# Patient Record
Sex: Female | Born: 1940 | Race: White | Hispanic: No | Marital: Single | Smoking: Current every day smoker
Health system: Southern US, Community
[De-identification: ages and names within clinical notes are randomized; demographics above are authoritative.]

## PROBLEM LIST (undated history)

## (undated) DIAGNOSIS — J449 Chronic obstructive pulmonary disease, unspecified: Secondary | ICD-10-CM

## (undated) DIAGNOSIS — K219 Gastro-esophageal reflux disease without esophagitis: Secondary | ICD-10-CM

## (undated) DIAGNOSIS — I10 Essential (primary) hypertension: Secondary | ICD-10-CM

## (undated) DIAGNOSIS — M1990 Unspecified osteoarthritis, unspecified site: Secondary | ICD-10-CM

## (undated) HISTORY — PX: CHOLECYSTECTOMY: SHX55

## (undated) HISTORY — PX: COLONOSCOPY: SHX174

## (undated) HISTORY — PX: TONSILLECTOMY: SUR1361

## (undated) HISTORY — PX: BACK SURGERY: SHX140

---

## 2004-10-01 ENCOUNTER — Ambulatory Visit: Payer: Self-pay | Admitting: Family Medicine

## 2005-11-06 ENCOUNTER — Ambulatory Visit: Payer: Self-pay | Admitting: Family Medicine

## 2006-05-21 ENCOUNTER — Ambulatory Visit: Payer: Self-pay | Admitting: Unknown Physician Specialty

## 2006-09-15 ENCOUNTER — Ambulatory Visit: Payer: Self-pay | Admitting: Family Medicine

## 2006-09-23 ENCOUNTER — Ambulatory Visit: Payer: Self-pay | Admitting: Anesthesiology

## 2006-10-05 ENCOUNTER — Ambulatory Visit: Payer: Self-pay | Admitting: Anesthesiology

## 2006-10-13 ENCOUNTER — Ambulatory Visit (HOSPITAL_COMMUNITY): Admission: RE | Admit: 2006-10-13 | Discharge: 2006-10-13 | Payer: Self-pay | Admitting: Neurosurgery

## 2006-11-11 ENCOUNTER — Encounter: Payer: Self-pay | Admitting: Neurosurgery

## 2006-12-10 ENCOUNTER — Encounter: Payer: Self-pay | Admitting: Neurosurgery

## 2007-01-14 ENCOUNTER — Ambulatory Visit: Payer: Self-pay | Admitting: Family Medicine

## 2007-10-01 ENCOUNTER — Ambulatory Visit: Payer: Self-pay | Admitting: Internal Medicine

## 2008-05-31 ENCOUNTER — Ambulatory Visit: Payer: Self-pay | Admitting: Obstetrics and Gynecology

## 2009-07-18 ENCOUNTER — Ambulatory Visit: Payer: Self-pay | Admitting: Internal Medicine

## 2010-03-04 ENCOUNTER — Ambulatory Visit: Payer: Self-pay | Admitting: Internal Medicine

## 2010-06-21 ENCOUNTER — Encounter: Payer: Self-pay | Admitting: Neurosurgery

## 2010-07-11 ENCOUNTER — Encounter: Payer: Self-pay | Admitting: Neurosurgery

## 2010-08-11 ENCOUNTER — Encounter: Payer: Self-pay | Admitting: Neurosurgery

## 2010-08-20 ENCOUNTER — Ambulatory Visit: Payer: Self-pay | Admitting: Internal Medicine

## 2011-09-11 ENCOUNTER — Ambulatory Visit: Payer: Self-pay | Admitting: Internal Medicine

## 2011-12-18 ENCOUNTER — Ambulatory Visit: Payer: Self-pay

## 2012-09-13 ENCOUNTER — Ambulatory Visit: Payer: Self-pay | Admitting: Internal Medicine

## 2012-11-05 ENCOUNTER — Ambulatory Visit: Payer: Self-pay | Admitting: Physical Medicine and Rehabilitation

## 2013-02-16 ENCOUNTER — Ambulatory Visit: Payer: Self-pay | Admitting: Unknown Physician Specialty

## 2013-03-17 ENCOUNTER — Ambulatory Visit: Payer: Self-pay | Admitting: Unknown Physician Specialty

## 2013-03-21 LAB — PATHOLOGY REPORT

## 2013-09-14 ENCOUNTER — Ambulatory Visit: Payer: Self-pay | Admitting: Internal Medicine

## 2014-02-16 ENCOUNTER — Encounter: Payer: Self-pay | Admitting: Physical Medicine and Rehabilitation

## 2014-03-11 ENCOUNTER — Encounter: Payer: Self-pay | Admitting: Physical Medicine and Rehabilitation

## 2014-03-22 ENCOUNTER — Ambulatory Visit: Payer: Self-pay | Admitting: Physical Medicine and Rehabilitation

## 2014-07-13 ENCOUNTER — Ambulatory Visit: Payer: Self-pay | Admitting: Internal Medicine

## 2014-07-17 ENCOUNTER — Other Ambulatory Visit (HOSPITAL_COMMUNITY): Payer: Self-pay | Admitting: Neurosurgery

## 2014-07-18 ENCOUNTER — Ambulatory Visit: Payer: Self-pay | Admitting: Unknown Physician Specialty

## 2014-08-10 NOTE — Pre-Procedure Instructions (Signed)
Patricia Reid  08/10/2014   Your procedure is scheduled on:  Monday, Jan. 11th   Report to [facility] Admitting at  7:45 AM.             (Arrival time is per your surgeon's request)   Call this number if you have problems the morning of surgery: [phone]   Remember:   Do not eat food or drink liquids after midnight Sunday.   Take these medicines the morning of surgery with A SIP OF WATER: Nexium, Gabapentin, Tramadol.  Nasonex spray.    Do not wear jewelry, make-up or nail polish.  Do not wear lotions, powders, or perfumes. You may NOT wear deodorant the day of surgery.  Do not shave underarms & legs 48 hours prior to surgery.   Do not bring valuables to the hospital.  [facility] is not responsible for any belongings or valuables.               Contacts, dentures or bridgework may not be worn into surgery.  Leave suitcase in the car. After surgery it may be brought to your room.  For patients admitted to the hospital, discharge time is determined by your treatment team.    Name and phone number of your driver:    Special Instructions: "Preparing for Surgery" instruction sheet.   Please read over the following fact sheets that you were given: Pain Booklet, Coughing and Deep Breathing, Blood Transfusion Information, MRSA Information and Surgical Site Infection Prevention

## 2014-08-14 ENCOUNTER — Other Ambulatory Visit: Payer: Self-pay

## 2014-08-14 ENCOUNTER — Encounter (HOSPITAL_COMMUNITY): Payer: Self-pay

## 2014-08-14 ENCOUNTER — Encounter (HOSPITAL_COMMUNITY)
Admission: RE | Admit: 2014-08-14 | Discharge: 2014-08-14 | Disposition: A | Discharge status: Home / Self Care | Payer: Medicare Other | Source: Ambulatory Visit | Attending: Neurosurgery | Admitting: Neurosurgery

## 2014-08-14 DIAGNOSIS — K219 Gastro-esophageal reflux disease without esophagitis: Secondary | ICD-10-CM | POA: Insufficient documentation

## 2014-08-14 DIAGNOSIS — Z9049 Acquired absence of other specified parts of digestive tract: Secondary | ICD-10-CM | POA: Diagnosis not present

## 2014-08-14 DIAGNOSIS — F17210 Nicotine dependence, cigarettes, uncomplicated: Secondary | ICD-10-CM | POA: Diagnosis not present

## 2014-08-14 DIAGNOSIS — I10 Essential (primary) hypertension: Secondary | ICD-10-CM | POA: Diagnosis not present

## 2014-08-14 DIAGNOSIS — J449 Chronic obstructive pulmonary disease, unspecified: Secondary | ICD-10-CM | POA: Diagnosis not present

## 2014-08-14 DIAGNOSIS — Z01818 Encounter for other preprocedural examination: Secondary | ICD-10-CM | POA: Diagnosis present

## 2014-08-14 HISTORY — DX: Gastro-esophageal reflux disease without esophagitis: K21.9

## 2014-08-14 HISTORY — DX: Chronic obstructive pulmonary disease, unspecified: J44.9

## 2014-08-14 HISTORY — DX: Essential (primary) hypertension: I10

## 2014-08-14 LAB — CBC
HCT: 38.7 % (ref 36.0–46.0)
Hemoglobin: 12.5 g/dL (ref 12.0–15.0)
MCH: 25.9 pg — AB (ref 26.0–34.0)
MCHC: 32.3 g/dL (ref 30.0–36.0)
MCV: 80.3 fL (ref 78.0–100.0)
Platelets: 278 10*3/uL (ref 150–400)
RBC: 4.82 MIL/uL (ref 3.87–5.11)
RDW: 16.2 % — AB (ref 11.5–15.5)
WBC: 6.9 10*3/uL (ref 4.0–10.5)

## 2014-08-14 LAB — BASIC METABOLIC PANEL
Anion gap: 6 (ref 5–15)
BUN: 7 mg/dL (ref 6–23)
CHLORIDE: 97 meq/L (ref 96–112)
CO2: 29 mmol/L (ref 19–32)
CREATININE: 0.73 mg/dL (ref 0.50–1.10)
Calcium: 9 mg/dL (ref 8.4–10.5)
GFR calc Af Amer: >90 mL/min (ref >90)
GFR, EST NON AFRICAN AMERICAN: 84 mL/min — AB (ref >90)
Glucose, Bld: 103 mg/dL — ABNORMAL HIGH (ref 70–99)
POTASSIUM: 3.4 mmol/L — AB (ref 3.5–5.1)
SODIUM: 132 mmol/L — AB (ref 135–145)

## 2014-08-14 LAB — SURGICAL PCR SCREEN
MRSA, PCR: NEGATIVE
STAPHYLOCOCCUS AUREUS: NEGATIVE

## 2014-08-14 NOTE — Progress Notes (Signed)
Patient does not want to wear the blue blood band x 1 week.  She's had neither children or blood transfusion.  She understands she will get sample drawn the DOS.

## 2014-08-15 NOTE — Progress Notes (Signed)
Anesthesia Chart Review:  Patient is a 74 year old female scheduled for L4-5 PLIF on 08/21/14 by Dr. Arnoldo Morale.  History includes smoking, COPD, HTN, GERD, cholecystectomy, tonsillectomy, back surgery '08. PCP is Dr. Kerrin Mo (see Care Everywhere).  08/14/14 EKG: ST at 102 bpm, non-specific ST/T wave abnormality, new since previous tracing in [facility] from 10/12/06. No previous cardiac studies per PAT history.  Preoperative labs  She requested that T&S be done on the day of surgery.  No CV symptoms documented at her PAT visit.  She does smoke.  Further evaluation by her assigned anesthesiologist on the day of surgery, but if no acute cardiopulmonary issues then I would anticipate that she could proceed as planned.  George Hugh [facility] Short Stay Center/Anesthesiology Phone [phone] 08/15/2014 3:33 PM

## 2014-08-20 MED ORDER — CEFAZOLIN SODIUM-DEXTROSE 2-3 GM-% IV SOLR
2.0000 g | INTRAVENOUS | Status: AC
  Administered 2014-08-21 (×2): 2 g via INTRAVENOUS
  Filled 2014-08-20: qty 50

## 2014-08-21 ENCOUNTER — Encounter (HOSPITAL_COMMUNITY): Payer: Self-pay | Admitting: *Deleted

## 2014-08-21 ENCOUNTER — Encounter (HOSPITAL_COMMUNITY)
Admission: RE | Disposition: A | Discharge status: Home / Self Care | Payer: Medicare Other | Source: Ambulatory Visit | Attending: Neurosurgery

## 2014-08-21 ENCOUNTER — Inpatient Hospital Stay (HOSPITAL_COMMUNITY): Payer: Medicare Other | Admitting: Certified Registered Nurse Anesthetist

## 2014-08-21 ENCOUNTER — Inpatient Hospital Stay (HOSPITAL_COMMUNITY): Payer: Medicare Other

## 2014-08-21 ENCOUNTER — Inpatient Hospital Stay (HOSPITAL_COMMUNITY): Payer: Medicare Other | Admitting: Vascular Surgery

## 2014-08-21 ENCOUNTER — Inpatient Hospital Stay (HOSPITAL_COMMUNITY)
Admission: RE | Admit: 2014-08-21 | Discharge: 2014-08-22 | DRG: 460 | Disposition: A | Discharge status: Home / Self Care | Payer: Medicare Other | Source: Ambulatory Visit | Attending: Neurosurgery | Admitting: Neurosurgery

## 2014-08-21 DIAGNOSIS — M5116 Intervertebral disc disorders with radiculopathy, lumbar region: Secondary | ICD-10-CM | POA: Diagnosis present

## 2014-08-21 DIAGNOSIS — I10 Essential (primary) hypertension: Secondary | ICD-10-CM | POA: Diagnosis present

## 2014-08-21 DIAGNOSIS — Z79899 Other long term (current) drug therapy: Secondary | ICD-10-CM

## 2014-08-21 DIAGNOSIS — J449 Chronic obstructive pulmonary disease, unspecified: Secondary | ICD-10-CM | POA: Diagnosis present

## 2014-08-21 DIAGNOSIS — Z885 Allergy status to narcotic agent status: Secondary | ICD-10-CM

## 2014-08-21 DIAGNOSIS — Z9049 Acquired absence of other specified parts of digestive tract: Secondary | ICD-10-CM | POA: Diagnosis present

## 2014-08-21 DIAGNOSIS — K219 Gastro-esophageal reflux disease without esophagitis: Secondary | ICD-10-CM | POA: Diagnosis present

## 2014-08-21 DIAGNOSIS — M4806 Spinal stenosis, lumbar region: Secondary | ICD-10-CM | POA: Diagnosis present

## 2014-08-21 DIAGNOSIS — M479 Spondylosis, unspecified: Secondary | ICD-10-CM

## 2014-08-21 DIAGNOSIS — F17210 Nicotine dependence, cigarettes, uncomplicated: Secondary | ICD-10-CM | POA: Diagnosis present

## 2014-08-21 DIAGNOSIS — M4316 Spondylolisthesis, lumbar region: Secondary | ICD-10-CM | POA: Diagnosis present

## 2014-08-21 DIAGNOSIS — Z419 Encounter for procedure for purposes other than remedying health state, unspecified: Secondary | ICD-10-CM

## 2014-08-21 LAB — ABO/RH: ABO/RH(D): O POS

## 2014-08-21 LAB — TYPE AND SCREEN
ABO/RH(D): O POS
ANTIBODY SCREEN: NEGATIVE

## 2014-08-21 SURGERY — POSTERIOR LUMBAR FUSION 1 LEVEL
Anesthesia: General

## 2014-08-21 MED ORDER — ACETAMINOPHEN 650 MG RE SUPP: 650.0000 mg | RECTAL | Status: DC | PRN

## 2014-08-21 MED ORDER — MIDAZOLAM HCL 5 MG/5ML IJ SOLN
INTRAMUSCULAR | Status: DC | PRN
  Administered 2014-08-21: 2 mg via INTRAVENOUS

## 2014-08-21 MED ORDER — HYDROCHLOROTHIAZIDE 12.5 MG PO CAPS
12.5000 mg | ORAL_CAPSULE | Freq: Every day | ORAL | Status: DC
  Administered 2014-08-22: 12.5 mg via ORAL
  Filled 2014-08-21 (×2): qty 1

## 2014-08-21 MED ORDER — SODIUM CHLORIDE 0.9 % IV SOLN
10.0000 mg | INTRAVENOUS | Status: DC | PRN
  Administered 2014-08-21: 5 ug/min via INTRAVENOUS

## 2014-08-21 MED ORDER — ROCURONIUM BROMIDE 50 MG/5ML IV SOLN
INTRAVENOUS | Status: AC
  Filled 2014-08-21: qty 1

## 2014-08-21 MED ORDER — MIDAZOLAM HCL 2 MG/2ML IJ SOLN
INTRAMUSCULAR | Status: AC
  Filled 2014-08-21: qty 2

## 2014-08-21 MED ORDER — FENTANYL CITRATE 0.05 MG/ML IJ SOLN
25.0000 ug | INTRAMUSCULAR | Status: DC | PRN
  Administered 2014-08-21 (×3): 50 ug via INTRAVENOUS

## 2014-08-21 MED ORDER — DEXMEDETOMIDINE BOLUS VIA INFUSION
0.7000 ug/kg | Freq: Once | INTRAVENOUS | Status: AC
  Administered 2014-08-21: 47.11 ug via INTRAVENOUS
  Filled 2014-08-21: qty 48

## 2014-08-21 MED ORDER — ONDANSETRON HCL 4 MG/2ML IJ SOLN
INTRAMUSCULAR | Status: DC | PRN
  Administered 2014-08-21: 4 mg via INTRAVENOUS

## 2014-08-21 MED ORDER — OXYCODONE HCL 5 MG PO TABS
5.0000 mg | ORAL_TABLET | Freq: Once | ORAL | Status: AC | PRN
  Administered 2014-08-21: 5 mg via ORAL

## 2014-08-21 MED ORDER — NEOSTIGMINE METHYLSULFATE 10 MG/10ML IV SOLN
INTRAVENOUS | Status: AC
  Filled 2014-08-21: qty 1

## 2014-08-21 MED ORDER — LACTATED RINGERS IV SOLN
INTRAVENOUS | Status: DC
  Administered 2014-08-21: 50 mL/h via INTRAVENOUS

## 2014-08-21 MED ORDER — LIDOCAINE HCL (CARDIAC) 20 MG/ML IV SOLN
INTRAVENOUS | Status: DC | PRN
  Administered 2014-08-21: 40 mg via INTRAVENOUS

## 2014-08-21 MED ORDER — SODIUM CHLORIDE 0.9 % IJ SOLN
INTRAMUSCULAR | Status: AC
  Filled 2014-08-21: qty 10

## 2014-08-21 MED ORDER — MIDAZOLAM HCL 2 MG/2ML IJ SOLN
1.0000 mg | INTRAMUSCULAR | Status: DC | PRN
Start: 2014-08-21 — End: 2014-08-21
  Administered 2014-08-21: 1 mg via INTRAVENOUS

## 2014-08-21 MED ORDER — HEMOSTATIC AGENTS (NO CHARGE) OPTIME
TOPICAL | Status: DC | PRN
  Administered 2014-08-21: 1 via TOPICAL

## 2014-08-21 MED ORDER — LACTATED RINGERS IV SOLN
INTRAVENOUS | Status: DC | PRN
  Administered 2014-08-21 (×2): via INTRAVENOUS

## 2014-08-21 MED ORDER — DIAZEPAM 5 MG PO TABS
ORAL_TABLET | ORAL | Status: AC
  Filled 2014-08-21: qty 1

## 2014-08-21 MED ORDER — 0.9 % SODIUM CHLORIDE (POUR BTL) OPTIME
TOPICAL | Status: DC | PRN
  Administered 2014-08-21: 1000 mL

## 2014-08-21 MED ORDER — ONDANSETRON HCL 4 MG/2ML IJ SOLN
INTRAMUSCULAR | Status: AC
  Filled 2014-08-21: qty 2

## 2014-08-21 MED ORDER — OXYCODONE HCL 5 MG PO TABS
ORAL_TABLET | ORAL | Status: AC
  Filled 2014-08-21: qty 1

## 2014-08-21 MED ORDER — OXYCODONE-ACETAMINOPHEN 5-325 MG PO TABS
1.0000 | ORAL_TABLET | ORAL | Status: DC | PRN
  Administered 2014-08-21: 2 via ORAL
  Filled 2014-08-21: qty 2

## 2014-08-21 MED ORDER — OXYCODONE HCL 5 MG/5ML PO SOLN: 5.0000 mg | Freq: Once | ORAL | Status: AC | PRN

## 2014-08-21 MED ORDER — PROPOFOL 10 MG/ML IV BOLUS
INTRAVENOUS | Status: DC | PRN
  Administered 2014-08-21: 140 mg via INTRAVENOUS

## 2014-08-21 MED ORDER — FLUTICASONE PROPIONATE 50 MCG/ACT NA SUSP
1.0000 | Freq: Every day | NASAL | Status: DC
  Administered 2014-08-22: 1 via NASAL
  Filled 2014-08-21: qty 16

## 2014-08-21 MED ORDER — DEXAMETHASONE SODIUM PHOSPHATE 4 MG/ML IJ SOLN
INTRAMUSCULAR | Status: AC
  Filled 2014-08-21: qty 2

## 2014-08-21 MED ORDER — ALUM & MAG HYDROXIDE-SIMETH 200-200-20 MG/5ML PO SUSP: 30.0000 mL | Freq: Four times a day (QID) | ORAL | Status: DC | PRN

## 2014-08-21 MED ORDER — ADULT MULTIVITAMIN LIQUID CH
5.0000 mL | Freq: Every day | ORAL | Status: DC
  Filled 2014-08-21: qty 5

## 2014-08-21 MED ORDER — ONDANSETRON HCL 4 MG/2ML IJ SOLN: 4.0000 mg | INTRAMUSCULAR | Status: DC | PRN

## 2014-08-21 MED ORDER — FENTANYL CITRATE 0.05 MG/ML IJ SOLN
INTRAMUSCULAR | Status: DC | PRN
  Administered 2014-08-21: 100 ug via INTRAVENOUS
  Administered 2014-08-21 (×6): 25 ug via INTRAVENOUS
  Administered 2014-08-21: 50 ug via INTRAVENOUS

## 2014-08-21 MED ORDER — LIDOCAINE HCL (CARDIAC) 20 MG/ML IV SOLN
INTRAVENOUS | Status: AC
  Filled 2014-08-21: qty 5

## 2014-08-21 MED ORDER — GABAPENTIN 300 MG PO CAPS
300.0000 mg | ORAL_CAPSULE | Freq: Three times a day (TID) | ORAL | Status: DC
  Administered 2014-08-22: 300 mg via ORAL
  Filled 2014-08-21 (×4): qty 1

## 2014-08-21 MED ORDER — CEFAZOLIN SODIUM-DEXTROSE 2-3 GM-% IV SOLR
2.0000 g | Freq: Three times a day (TID) | INTRAVENOUS | Status: AC
  Administered 2014-08-21 – 2014-08-22 (×2): 2 g via INTRAVENOUS
  Filled 2014-08-21 (×2): qty 50

## 2014-08-21 MED ORDER — NEOSTIGMINE METHYLSULFATE 10 MG/10ML IV SOLN
INTRAVENOUS | Status: DC | PRN
  Administered 2014-08-21: 4 mg via INTRAVENOUS

## 2014-08-21 MED ORDER — BUPIVACAINE-EPINEPHRINE (PF) 0.5% -1:200000 IJ SOLN
INTRAMUSCULAR | Status: DC | PRN
  Administered 2014-08-21: 10 mL

## 2014-08-21 MED ORDER — FENTANYL CITRATE 0.05 MG/ML IJ SOLN
INTRAMUSCULAR | Status: AC
  Filled 2014-08-21: qty 5

## 2014-08-21 MED ORDER — DEXAMETHASONE SODIUM PHOSPHATE 4 MG/ML IJ SOLN
INTRAMUSCULAR | Status: DC | PRN
  Administered 2014-08-21: 8 mg via INTRAVENOUS

## 2014-08-21 MED ORDER — GLYCOPYRROLATE 0.2 MG/ML IJ SOLN
INTRAMUSCULAR | Status: DC | PRN
  Administered 2014-08-21: 0.6 mg via INTRAVENOUS

## 2014-08-21 MED ORDER — PHENOL 1.4 % MT LIQD: 1.0000 | OROMUCOSAL | Status: DC | PRN

## 2014-08-21 MED ORDER — ROCURONIUM BROMIDE 100 MG/10ML IV SOLN
INTRAVENOUS | Status: DC | PRN
  Administered 2014-08-21: 40 mg via INTRAVENOUS
  Administered 2014-08-21: 10 mg via INTRAVENOUS

## 2014-08-21 MED ORDER — DOCUSATE SODIUM 100 MG PO CAPS
100.0000 mg | ORAL_CAPSULE | Freq: Two times a day (BID) | ORAL | Status: DC
  Administered 2014-08-21 – 2014-08-22 (×2): 100 mg via ORAL
  Filled 2014-08-21 (×2): qty 1

## 2014-08-21 MED ORDER — ARTIFICIAL TEARS OP OINT
TOPICAL_OINTMENT | OPHTHALMIC | Status: AC
  Filled 2014-08-21: qty 3.5

## 2014-08-21 MED ORDER — HYDROCODONE-ACETAMINOPHEN 5-325 MG PO TABS: 1.0000 | ORAL_TABLET | ORAL | Status: DC | PRN

## 2014-08-21 MED ORDER — ESTRADIOL 0.1 MG/GM VA CREA
0.2500 | TOPICAL_CREAM | VAGINAL | Status: DC
  Filled 2014-08-21: qty 42.5

## 2014-08-21 MED ORDER — LACTATED RINGERS IV SOLN: INTRAVENOUS | Status: DC

## 2014-08-21 MED ORDER — PANTOPRAZOLE SODIUM 40 MG PO TBEC
80.0000 mg | DELAYED_RELEASE_TABLET | Freq: Every day | ORAL | Status: DC
  Administered 2014-08-22: 80 mg via ORAL
  Filled 2014-08-21: qty 2

## 2014-08-21 MED ORDER — PROPOFOL 10 MG/ML IV BOLUS
INTRAVENOUS | Status: AC
  Filled 2014-08-21: qty 20

## 2014-08-21 MED ORDER — PHENYLEPHRINE HCL 10 MG/ML IJ SOLN
INTRAMUSCULAR | Status: DC | PRN
  Administered 2014-08-21: 40 ug via INTRAVENOUS
  Administered 2014-08-21 (×2): 80 ug via INTRAVENOUS
  Administered 2014-08-21: 40 ug via INTRAVENOUS

## 2014-08-21 MED ORDER — SODIUM CHLORIDE 0.9 % IR SOLN
Status: DC | PRN
  Administered 2014-08-21: 14:00:00

## 2014-08-21 MED ORDER — DEXMEDETOMIDINE HCL IN NACL 200 MCG/50ML IV SOLN
0.7000 ug/kg/h | INTRAVENOUS | Status: DC
  Administered 2014-08-21: 0.7 ug/kg/h via INTRAVENOUS
  Filled 2014-08-21: qty 50

## 2014-08-21 MED ORDER — AZELASTINE HCL 0.1 % NA SOLN
1.0000 | Freq: Two times a day (BID) | NASAL | Status: DC
  Administered 2014-08-21 – 2014-08-22 (×2): 1 via NASAL
  Filled 2014-08-21: qty 30

## 2014-08-21 MED ORDER — ARTIFICIAL TEARS OP OINT
TOPICAL_OINTMENT | OPHTHALMIC | Status: DC | PRN
  Administered 2014-08-21: 1 via OPHTHALMIC

## 2014-08-21 MED ORDER — THROMBIN 20000 UNITS EX SOLR
CUTANEOUS | Status: DC | PRN
  Administered 2014-08-21: 14:00:00 via TOPICAL

## 2014-08-21 MED ORDER — CEFAZOLIN SODIUM-DEXTROSE 2-3 GM-% IV SOLR
INTRAVENOUS | Status: AC
  Filled 2014-08-21: qty 50

## 2014-08-21 MED ORDER — ONDANSETRON HCL 4 MG/2ML IJ SOLN: 4.0000 mg | Freq: Once | INTRAMUSCULAR | Status: DC | PRN

## 2014-08-21 MED ORDER — FENTANYL CITRATE 0.05 MG/ML IJ SOLN
INTRAMUSCULAR | Status: AC
  Filled 2014-08-21: qty 2

## 2014-08-21 MED ORDER — GLYCOPYRROLATE 0.2 MG/ML IJ SOLN
INTRAMUSCULAR | Status: AC
  Filled 2014-08-21: qty 3

## 2014-08-21 MED ORDER — MENTHOL 3 MG MT LOZG: 1.0000 | LOZENGE | OROMUCOSAL | Status: DC | PRN

## 2014-08-21 MED ORDER — HYDROCHLOROTHIAZIDE 25 MG PO TABS
12.5000 mg | ORAL_TABLET | Freq: Every day | ORAL | Status: DC
  Filled 2014-08-21: qty 0.5

## 2014-08-21 MED ORDER — PHENYLEPHRINE 40 MCG/ML (10ML) SYRINGE FOR IV PUSH (FOR BLOOD PRESSURE SUPPORT)
PREFILLED_SYRINGE | INTRAVENOUS | Status: AC
  Filled 2014-08-21: qty 10

## 2014-08-21 MED ORDER — ACETAMINOPHEN 325 MG PO TABS: 650.0000 mg | ORAL_TABLET | ORAL | Status: DC | PRN

## 2014-08-21 MED ORDER — DIAZEPAM 5 MG PO TABS
5.0000 mg | ORAL_TABLET | Freq: Four times a day (QID) | ORAL | Status: DC | PRN
  Administered 2014-08-21 (×2): 5 mg via ORAL
  Filled 2014-08-21: qty 1

## 2014-08-21 MED ORDER — MORPHINE SULFATE 2 MG/ML IJ SOLN
1.0000 mg | INTRAMUSCULAR | Status: DC | PRN
  Administered 2014-08-21 – 2014-08-22 (×3): 2 mg via INTRAVENOUS
  Filled 2014-08-21 (×3): qty 1

## 2014-08-21 SURGICAL SUPPLY — 68 items
BAG DECANTER FOR FLEXI CONT (MISCELLANEOUS) ×3 IMPLANT
BENZOIN TINCTURE PRP APPL 2/3 (GAUZE/BANDAGES/DRESSINGS) ×3 IMPLANT
BRUSH SCRUB EZ PLAIN DRY (MISCELLANEOUS) ×3 IMPLANT
BUR MATCHSTICK NEURO 3.0 LAGG (BURR) ×3 IMPLANT
BUR PRECISION FLUTE 6.0 (BURR) ×3 IMPLANT
CANISTER SUCT 3000ML (MISCELLANEOUS) ×3 IMPLANT
CAP REVERE LOCKING (Cap) ×12 IMPLANT
CLOSURE WOUND 1/2 X4 (GAUZE/BANDAGES/DRESSINGS) ×1
CONT SPEC 4OZ CLIKSEAL STRL BL (MISCELLANEOUS) ×3 IMPLANT
CORDS BIPOLAR (ELECTRODE) ×3 IMPLANT
COVER BACK TABLE 60X90IN (DRAPES) ×3 IMPLANT
DRAPE C-ARM 42X72 X-RAY (DRAPES) ×6 IMPLANT
DRAPE LAPAROTOMY 100X72X124 (DRAPES) ×3 IMPLANT
DRAPE POUCH INSTRU U-SHP 10X18 (DRAPES) ×3 IMPLANT
DRAPE PROXIMA HALF (DRAPES) ×3 IMPLANT
DRAPE SURG 17X23 STRL (DRAPES) ×12 IMPLANT
DURASEAL APPLICATOR TIP (TIP) ×3 IMPLANT
DURASEAL SPINE SEALANT 3ML (MISCELLANEOUS) ×3 IMPLANT
ELECT BLADE 4.0 EZ CLEAN MEGAD (MISCELLANEOUS) ×3
ELECT REM PT RETURN 9FT ADLT (ELECTROSURGICAL) ×3
ELECTRODE BLDE 4.0 EZ CLN MEGD (MISCELLANEOUS) ×1 IMPLANT
ELECTRODE REM PT RTRN 9FT ADLT (ELECTROSURGICAL) ×1 IMPLANT
EVACUATOR 1/8 PVC DRAIN (DRAIN) IMPLANT
GAUZE SPONGE 4X4 12PLY STRL (GAUZE/BANDAGES/DRESSINGS) ×3 IMPLANT
GAUZE SPONGE 4X4 16PLY XRAY LF (GAUZE/BANDAGES/DRESSINGS) ×3 IMPLANT
GLOVE BIO SURGEON STRL SZ7 (GLOVE) ×3 IMPLANT
GLOVE BIO SURGEON STRL SZ8.5 (GLOVE) ×6 IMPLANT
GLOVE BIOGEL PI IND STRL 7.5 (GLOVE) ×1 IMPLANT
GLOVE BIOGEL PI INDICATOR 7.5 (GLOVE) ×2
GLOVE EXAM NITRILE LRG STRL (GLOVE) IMPLANT
GLOVE EXAM NITRILE MD LF STRL (GLOVE) IMPLANT
GLOVE EXAM NITRILE XL STR (GLOVE) IMPLANT
GLOVE EXAM NITRILE XS STR PU (GLOVE) IMPLANT
GLOVE SS BIOGEL STRL SZ 8 (GLOVE) ×2 IMPLANT
GLOVE SUPERSENSE BIOGEL SZ 8 (GLOVE) ×4
GOWN STRL REUS W/ TWL LRG LVL3 (GOWN DISPOSABLE) IMPLANT
GOWN STRL REUS W/ TWL XL LVL3 (GOWN DISPOSABLE) ×2 IMPLANT
GOWN STRL REUS W/TWL 2XL LVL3 (GOWN DISPOSABLE) IMPLANT
GOWN STRL REUS W/TWL LRG LVL3 (GOWN DISPOSABLE)
GOWN STRL REUS W/TWL XL LVL3 (GOWN DISPOSABLE) ×4
KIT BASIN OR (CUSTOM PROCEDURE TRAY) ×3 IMPLANT
KIT ROOM TURNOVER OR (KITS) ×3 IMPLANT
NEEDLE HYPO 21X1.5 SAFETY (NEEDLE) IMPLANT
NEEDLE HYPO 22GX1.5 SAFETY (NEEDLE) ×3 IMPLANT
NS IRRIG 1000ML POUR BTL (IV SOLUTION) ×3 IMPLANT
PACK LAMINECTOMY NEURO (CUSTOM PROCEDURE TRAY) ×3 IMPLANT
PAD ARMBOARD 7.5X6 YLW CONV (MISCELLANEOUS) ×9 IMPLANT
PATTIES SURGICAL .5 X1 (DISPOSABLE) IMPLANT
ROD REVERE CURVED 6.35X35MM (Rod) ×6 IMPLANT
SCREW REVERE 6.35 6.5MMX45 (Screw) ×6 IMPLANT
SCREW REVERE 6.5X50MM (Screw) ×9 IMPLANT
SPACER ALTERA 10X31 8-12MM-8 (Spacer) ×3 IMPLANT
SPONGE GAUZE 4X4 12PLY STER LF (GAUZE/BANDAGES/DRESSINGS) ×3 IMPLANT
SPONGE NEURO XRAY DETECT 1X3 (DISPOSABLE) IMPLANT
SPONGE SURGIFOAM ABS GEL 100 (HEMOSTASIS) ×3 IMPLANT
STRIP BIOACTIVE 20CC 25X100X8 (Miscellaneous) ×3 IMPLANT
STRIP CLOSURE SKIN 1/2X4 (GAUZE/BANDAGES/DRESSINGS) ×2 IMPLANT
SUT PROLENE 6 0 BV (SUTURE) ×3 IMPLANT
SUT VIC AB 1 CT1 18XBRD ANBCTR (SUTURE) ×2 IMPLANT
SUT VIC AB 1 CT1 8-18 (SUTURE) ×4
SUT VIC AB 2-0 CP2 18 (SUTURE) ×6 IMPLANT
SYR 20CC LL (SYRINGE) IMPLANT
SYR 20ML ECCENTRIC (SYRINGE) ×3 IMPLANT
TAPE CLOTH SURG 4X10 WHT LF (GAUZE/BANDAGES/DRESSINGS) ×3 IMPLANT
TOWEL OR 17X24 6PK STRL BLUE (TOWEL DISPOSABLE) ×3 IMPLANT
TOWEL OR 17X26 10 PK STRL BLUE (TOWEL DISPOSABLE) ×3 IMPLANT
TRAY FOLEY CATH 14FRSI W/METER (CATHETERS) ×3 IMPLANT
WATER STERILE IRR 1000ML POUR (IV SOLUTION) ×3 IMPLANT

## 2014-08-21 NOTE — Op Note (Signed)
Brief history: The patient is a 74 year old white female who has had previous lumbar surgery by another physician many years ago. She has developed recurrent back, buttock, and leg pain consistent with neurogenic claudication. She has failed medical management and was worked up with lumbar x-rays and a lumbar MRI. These demonstrated that the patient had a spondylolisthesis and spinal stenosis at L4-5. I discussed the various treatment options with the patient including surgery. She has weighed the risks, benefits, and alternative surgery and decided to proceed with an L4-5 decompression, instrumentation, and fusion.  Preoperative diagnosis: L4-5 spondylolisthesis, Degenerative disc disease, spinal stenosis compressing both the L4 and the L5 nerve roots; lumbago; lumbar radiculopathy  Postoperative diagnosis: The same  Procedure: Redo bilateral L4 Laminotomy/foraminotomies to decompress the bilateral L 4 and L5 nerve roots(the work required to do this was in addition to the work required to do the posterior lumbar interbody fusion because of the patient's spinal stenosis, facet arthropathy. Etc. requiring a wide decompression of the nerve roots.); Right L4-5 transforaminal lumbar interbody fusion with local morselized autograft bone and Kinnex graft extender; insertion of interbody prosthesis at L4-5 (globus peek" interbody prosthesis); posterior nonsegmental instrumentation from L4 to L5 with globus titanium pedicle screws and rods; posterior lateral arthrodesis at L4-5 with local morselized autograft bone and Kinnex bone graft extender.  Surgeon: Dr. Earle Gell  Asst.: Dr. Granville Lewis  Anesthesia: Gen. endotracheal  Estimated blood loss: 200 mL  Drains: One medium Hemovac  Complications: None  Description of procedure: The patient was brought to the operating room by the anesthesia team. General endotracheal anesthesia was induced. The patient was turned to the prone position on the Wilson  frame. The patient's lumbosacral region was then prepared with Betadine scrub and Betadine solution. Sterile drapes were applied.  I then injected the area to be incised with Marcaine with epinephrine solution. I then used the scalpel to make a linear midline incision over the L4-5 interspace. I then used electrocautery to perform a bilateral subperiosteal dissection exposing the spinous process and lamina of L4-5. We then obtained intraoperative radiograph to confirm our location. We then inserted the Verstrac retractor to provide exposure.  I began the decompression by using the high speed drill to perform laminotomies at L4 bilaterally, we encountered scar tissue on the right from the prior surgery. We then used the Kerrison punches to widen the laminotomy and removed the ligamentum flavum and epidural scar tissue at L4-5. We used the Kerrison punches to remove the medial facets at L4-5. We performed wide foraminotomies about the bilateral L4 and L5 nerve roots completing the decompression.  We now turned our attention to the posterior lumbar interbody fusion. I used a scalpel to incise the intervertebral disc at L4-5. I then performed a partial intervertebral discectomy at L4-5 using the pituitary forceps. We prepared the vertebral endplates at M8-4 for the fusion by removing the soft tissues with the curettes. We then used the trial spacers to pick the appropriate sized interbody prosthesis. We prefilled his prosthesis with a combination of local morselized autograft bone that we obtained during the decompression as well as Kinnex bone graft extender. We inserted the prefilled prosthesis into the interspace at L4-5 on the right. We expanded the prosthesis. There was a good snug fit of the prosthesis in the interspace. We then filled and the remainder of the intervertebral disc space with local morselized autograft bone and Kinnex. This completed the posterior lumbar interbody arthrodesis.  We now turned  attention to  the instrumentation. Under fluoroscopic guidance we cannulated the bilateral L4 and L5 pedicles with the bone probe. We then removed the bone probe. We then tapped the pedicle with a 5.5 millimeter tap. We then removed the tap. We probed inside the tapped pedicle with a ball probe to rule out cortical breaches. We then inserted a 6.5 x 45 and 50 millimeter pedicle screw into the L4 and L5 pedicles bilaterally under fluoroscopic guidance. We then palpated along the medial aspect of the pedicles to rule out cortical breaches. There were none. The nerve roots were not injured. We then connected the unilateral pedicle screws with a lordotic rod. We compressed the construct and secured the rod in place with the caps. We then tightened the caps appropriately. This completed the instrumentation from L4-5.  We now turned our attention to the posterior lateral arthrodesis at L4-5. We used the high-speed drill to decorticate the remainder of the facets, pars, transverse process at L4-5. We then applied a combination of local morselized autograft bone and Kinnex bone graft extender over these decorticated posterior lateral structures. This completed the posterior lateral arthrodesis.  We then obtained hemostasis using bipolar electrocautery. We irrigated the wound out with bacitracin solution. We inspected the thecal sac and nerve roots and noted they were well decompressed. We then removed the retractor. We placed a medium Hemovac drain in the epidural space and tunneled out through separate stab wound. We reapproximated patient's thoracolumbar fascia with interrupted #1 Vicryl suture. We reapproximated patient's subcutaneous tissue with interrupted 2-0 Vicryl suture. The reapproximated patient's skin with Steri-Strips and benzoin. The wound was then coated with bacitracin ointment. A sterile dressing was applied. The drapes were removed. The patient was subsequently returned to the supine position where they  were extubated by the anesthesia team. He was then transported to the post anesthesia care unit in stable condition. All sponge instrument and needle counts were reportedly correct at the end of this case.

## 2014-08-21 NOTE — Anesthesia Preprocedure Evaluation (Signed)
Anesthesia Evaluation  Patient identified by MRN, date of birth, ID band Patient awake    Reviewed: Allergy & Precautions, NPO status , Patient's Chart, lab work & pertinent test results  Airway Mallampati: II  TM Distance: >3 FB Neck ROM: Full    Dental  (+) Teeth Intact, Dental Advisory Given   Pulmonary Current Smoker,    breath sounds clear to auscultation       Cardiovascular hypertension,  Rhythm:Regular Rate:Normal     Neuro/Psych    GI/Hepatic   Endo/Other    Renal/GU      Musculoskeletal   Abdominal   Peds  Hematology   Anesthesia Other Findings   Reproductive/Obstetrics                            Anesthesia Physical Anesthesia Plan  ASA: III  Anesthesia Plan: General   Post-op Pain Management:    Induction: Intravenous  Airway Management Planned: Oral ETT  Additional Equipment:   Intra-op Plan:   Post-operative Plan: Extubation in OR  Informed Consent: I have reviewed the patients History and Physical, chart, labs and discussed the procedure including the risks, benefits and alternatives for the proposed anesthesia with the patient or authorized representative who has indicated his/her understanding and acceptance.   Dental advisory given  Plan Discussed with: CRNA and Anesthesiologist  Anesthesia Plan Comments:         Anesthesia Quick Evaluation  

## 2014-08-21 NOTE — Anesthesia Postprocedure Evaluation (Signed)
  Anesthesia Post-op Note  Patient: Patricia Reid  Procedure(s) Performed: Procedure(s) with comments: POSTERIOR LUMBAR FUSION 1 LEVEL (N/A) - L45 posterior lumbar interbody fusion with interbody prosthesis posterior lateral arthrodesis and posterior nonsegmental instrumentation  Patient Location: PACU  Anesthesia Type:General  Level of Consciousness: awake, alert  and oriented  Airway and Oxygen Therapy: Patient Spontanous Breathing and Patient connected to nasal cannula oxygen  Post-op Pain: mild  Post-op Assessment: Post-op Vital signs reviewed, Patient's Cardiovascular Status Stable, Respiratory Function Stable, Patent Airway, No signs of Nausea or vomiting and Pain level controlled  Post-op Vital Signs: stable  Last Vitals:  Filed Vitals:   08/21/14 1730  BP: 151/74  Pulse: 74  Temp: 36.3 C  Resp: 19    Complications: No apparent anesthesia complications

## 2014-08-21 NOTE — H&P (Signed)
Subjective: The patient is a 74 year old white female who has complained of back and leg pain consistent with neurogenic claudication. She has failed medical management and was worked up with a lumbar MRI and lumbar x-rays. These studies demonstrated an L4-5 spondylolisthesis with spinal stenosis. I discussed the various treatment options with the patient. She has decided to proceed with surgery.   Past Medical History  Diagnosis Date  . Hypertension   . GERD (gastroesophageal reflux disease)   . COPD (chronic obstructive pulmonary disease)     slim to none    Past Surgical History  Procedure Laterality Date  . Cholecystectomy    . Back surgery    . Tonsillectomy      Allergies  Allergen Reactions  . Codeine     Makes her feel strange    History  Substance Use Topics  . Smoking status: Current Every Day Smoker -- 1.00 packs/day for 50 years    Types: Cigarettes  . Smokeless tobacco: Not on file  . Alcohol Use: Yes     Comment: occasionally    History reviewed. No pertinent family history. Prior to Admission medications   Medication Sig Start Date End Date Taking? Authorizing Provider  azelastine (ASTELIN) 0.1 % nasal spray Place 1 spray into both nostrils 2 (two) times daily. 07/18/14  Yes Historical Provider, MD  Cholecalciferol (VITAMIN D) 2000 UNITS tablet Take 2,000 Units by mouth daily with supper.   Yes Historical Provider, MD  esomeprazole (NEXIUM) 40 MG capsule Take 40 mg by mouth 2 (two) times daily before a meal.   Yes Historical Provider, MD  estradiol (ESTRACE) 0.1 MG/GM vaginal cream Place 4.03 Applicatorfuls vaginally 2 (two) times a week. Days of the week vary   Yes Historical Provider, MD  gabapentin (NEURONTIN) 300 MG capsule Take 300 mg by mouth 3 (three) times daily. 06/07/14  Yes Historical Provider, MD  hydrochlorothiazide (HYDRODIURIL) 25 MG tablet Take 12.5 mg by mouth daily.  05/31/14  Yes Historical Provider, MD  mometasone (NASONEX) 50 MCG/ACT nasal  spray Place 2 sprays into both nostrils 2 (two) times daily.   Yes Historical Provider, MD  Multiple Vitamin (MULTIVITAMIN) LIQD Take 5 mLs by mouth daily.   Yes Historical Provider, MD  senna-docusate (SENNA-S) 8.6-50 MG per tablet Take 4 tablets by mouth daily with supper.   Yes Historical Provider, MD  temazepam (RESTORIL) 15 MG capsule Take 1 capsule by mouth at bedtime as needed.   Yes Historical Provider, MD  traMADol (ULTRAM) 50 MG tablet Take 25-100 mg by mouth 2 (two) times daily.  07/05/14  Yes Historical Provider, MD  Wheat Dextrin (BENEFIBER PO) Take 30 mLs by mouth 3 (three) times daily. Mix in 8 oz liquid and drink   Yes Historical Provider, MD  celecoxib (CELEBREX) 200 MG capsule Take 200 mg by mouth 2 (two) times daily.  06/07/14   Historical Provider, MD     Review of Systems  Positive ROS: As above  All other systems have been reviewed and were otherwise negative with the exception of those mentioned in the HPI and as above.  Objective: Vital signs in last 24 hours: Temp:  [98.4 F (36.9 C)] 98.4 F (36.9 C) (01/11 0810) Pulse Rate:  [93] 93 (01/11 0810) Resp:  [20] 20 (01/11 0810) BP: (175)/(70) 175/70 mmHg (01/11 0810) SpO2:  [97 %] 97 % (01/11 0810) Weight:  [67.302 kg (148 lb 6 oz)] 67.302 kg (148 lb 6 oz) (01/11 0810)  General Appearance: Alert, cooperative, no  distress, Head: Normocephalic, without obvious abnormality, atraumatic Eyes: PERRL, conjunctiva/corneas clear, EOM's intact,    Ears: Normal  Throat: Normal  Neck: Supple, symmetrical, trachea midline, no adenopathy; thyroid: No enlargement/tenderness/nodules; no carotid bruit or JVD Back: Symmetric, no curvature, ROM normal, no CVA tenderness Lungs: Clear to auscultation bilaterally, respirations unlabored Heart: Regular rate and rhythm, no murmur, rub or gallop Abdomen: Soft, non-tender,, no masses, no organomegaly Extremities: Extremities normal, atraumatic, no cyanosis or edema Pulses: 2+ and  symmetric all extremities Skin: Skin color, texture, turgor normal, no rashes or lesions  NEUROLOGIC:   Mental status: alert and oriented, no aphasia, good attention span, Fund of knowledge/ memory ok Motor Exam - grossly normal Sensory Exam - grossly normal Reflexes:  Coordination - grossly normal Gait - grossly normal Balance - grossly normal Cranial Nerves: I: smell Not tested  II: visual acuity  OS: Normal  OD: Normal   II: visual fields Full to confrontation  II: pupils Equal, round, reactive to light  III,VII: ptosis None  III,IV,VI: extraocular muscles  Full ROM  V: mastication Normal  V: facial light touch sensation  Normal  V,VII: corneal reflex  Present  VII: facial muscle function - upper  Normal  VII: facial muscle function - lower Normal  VIII: hearing Not tested  IX: soft palate elevation  Normal  IX,X: gag reflex Present  XI: trapezius strength  5/5  XI: sternocleidomastoid strength 5/5  XI: neck flexion strength  5/5  XII: tongue strength  Normal    Data Review Lab Results  Component Value Date   WBC 6.9 08/14/2014   HGB 12.5 08/14/2014   HCT 38.7 08/14/2014   MCV 80.3 08/14/2014   PLT 278 08/14/2014   Lab Results  Component Value Date   NA 132* 08/14/2014   K 3.4* 08/14/2014   CL 97 08/14/2014   CO2 29 08/14/2014   BUN 7 08/14/2014   CREATININE 0.73 08/14/2014   GLUCOSE 103* 08/14/2014   No results found for: INR, PROTIME  Assessment/Plan: L4-5 spondylolisthesis, spinal stenosis, lumbago, lumbar radiculopathy, neurogenic claudication: I have discussed the situation with the patient. I have reviewed her imaging studies with her and pointed out the abnormality is. We have discussed the various treatment options including surgery. I have described the surgical treatment option of a L4-5 decompression, instrumentation, and fusion. I have shown her surgical models. We have discussed the risks, benefits, alternatives, and likelihood of achieving goals  with surgery. I have answered all the patient's questions. She has decided to proceed with surgery.   Reign Dziuba D 08/21/2014 10:32 AM

## 2014-08-21 NOTE — Transfer of Care (Signed)
Immediate Anesthesia Transfer of Care Note  Patient: Patricia Reid  Procedure(s) Performed: Procedure(s) with comments: POSTERIOR LUMBAR FUSION 1 LEVEL (N/A) - L45 posterior lumbar interbody fusion with interbody prosthesis posterior lateral arthrodesis and posterior nonsegmental instrumentation  Patient Location: PACU  Anesthesia Type:General  Level of Consciousness: awake, alert , oriented and patient cooperative  Airway & Oxygen Therapy: Patient Spontanous Breathing and Patient connected to face mask oxygen  Post-op Assessment: Report given to PACU RN, Post -op Vital signs reviewed and stable and Patient moving all extremities X 4  Post vital signs: Reviewed and stable  Complications: No apparent anesthesia complications

## 2014-08-22 LAB — CBC
HEMATOCRIT: 28.8 % — AB (ref 36.0–46.0)
HEMOGLOBIN: 9.2 g/dL — AB (ref 12.0–15.0)
MCH: 25.4 pg — ABNORMAL LOW (ref 26.0–34.0)
MCHC: 31.9 g/dL (ref 30.0–36.0)
MCV: 79.6 fL (ref 78.0–100.0)
Platelets: 221 10*3/uL (ref 150–400)
RBC: 3.62 MIL/uL — ABNORMAL LOW (ref 3.87–5.11)
RDW: 15.8 % — ABNORMAL HIGH (ref 11.5–15.5)
WBC: 8 10*3/uL (ref 4.0–10.5)

## 2014-08-22 LAB — BASIC METABOLIC PANEL
Anion gap: 12 (ref 5–15)
BUN: <5 mg/dL — ABNORMAL LOW (ref 6–23)
CHLORIDE: 95 meq/L — AB (ref 96–112)
CO2: 25 mmol/L (ref 19–32)
Calcium: 8.5 mg/dL (ref 8.4–10.5)
Creatinine, Ser: 0.59 mg/dL (ref 0.50–1.10)
Glucose, Bld: 112 mg/dL — ABNORMAL HIGH (ref 70–99)
POTASSIUM: 3.1 mmol/L — AB (ref 3.5–5.1)
Sodium: 132 mmol/L — ABNORMAL LOW (ref 135–145)

## 2014-08-22 MED ORDER — DSS 100 MG PO CAPS: 100.0000 mg | ORAL_CAPSULE | Freq: Two times a day (BID) | ORAL | Status: DC

## 2014-08-22 MED ORDER — DIAZEPAM 5 MG PO TABS: 5.0000 mg | ORAL_TABLET | Freq: Four times a day (QID) | ORAL | Status: DC | PRN

## 2014-08-22 MED ORDER — HYDROMORPHONE HCL 4 MG PO TABS: 4.0000 mg | ORAL_TABLET | ORAL | Status: DC | PRN

## 2014-08-22 MED ORDER — HYDROMORPHONE HCL 2 MG PO TABS
4.0000 mg | ORAL_TABLET | ORAL | Status: DC | PRN
  Administered 2014-08-22 (×2): 4 mg via ORAL
  Filled 2014-08-22 (×2): qty 2

## 2014-08-22 NOTE — Progress Notes (Signed)
Pt doing well. Pt given D/C instructions with Rx's, verbal understanding was provided. Pt's incision was covered with gauze dressing and had no sign of infection. Pt's IV and Hemovac were removed prior to D/C. Pt D/C'd home via wheelchair @ 1400 per MD order. Pt is stable @ D/C and has no other needs at this time. Holli Humbles, RN

## 2014-08-22 NOTE — Discharge Summary (Signed)
  Physician Discharge Summary  Patient ID: Patricia Reid MRN: [id number] DOB/AGE: 02-10-1941 74 y.o.  Admit date: 08/21/2014 Discharge date: 08/22/2014  Admission Diagnoses: L4-5 spondylolisthesis, spinal stenosis, lumbago, lumbar radiculopathy with neurogenic claudication  Discharge Diagnoses: The same Active Problems:   Spondylolisthesis of lumbar region   Discharged Condition: good  Hospital Course: I performed an L4-5 decompression, instrumentation, and fusion on the patient on 08/21/2014. The surgery went well.  The patient's postoperative course was unremarkable. On postop day #1 the patient felt she would to go home later that day. The patient was given oral and written discharge instructions. All questions were answered.  Consults: PT Significant Diagnostic Studies: None Treatments: L4-5 decompression, instrumentation, and fusion. Discharge Exam: Blood pressure 132/64, pulse 83, temperature 98.4 F (36.9 C), temperature source Oral, resp. rate 18, height 5' 5.5" (1.664 m), weight 67.302 kg (148 lb 6 oz), SpO2 97 %. The patient is alert and pleasant. Her dressing is clean and dry. Her strength is normal in her lower extremities. She looks well.  Disposition: Home     Medication List    STOP taking these medications        celecoxib 200 MG capsule  Commonly known as:  CELEBREX     traMADol 50 MG tablet  Commonly known as:  ULTRAM      TAKE these medications        azelastine 0.1 % nasal spray  Commonly known as:  ASTELIN  Place 1 spray into both nostrils 2 (two) times daily.     BENEFIBER PO  Take 30 mLs by mouth 3 (three) times daily. Mix in 8 oz liquid and drink     diazepam 5 MG tablet  Commonly known as:  VALIUM  Take 1 tablet (5 mg total) by mouth every 6 (six) hours as needed for muscle spasms.     DSS 100 MG Caps  Take 100 mg by mouth 2 (two) times daily.     esomeprazole 40 MG capsule  Commonly known as:  NEXIUM  Take 40 mg by mouth 2 (two)  times daily before a meal.     estradiol 0.1 MG/GM vaginal cream  Commonly known as:  ESTRACE  Place 3.08 Applicatorfuls vaginally 2 (two) times a week. Days of the week vary     gabapentin 300 MG capsule  Commonly known as:  NEURONTIN  Take 300 mg by mouth 3 (three) times daily.     hydrochlorothiazide 25 MG tablet  Commonly known as:  HYDRODIURIL  Take 12.5 mg by mouth daily.     HYDROmorphone 4 MG tablet  Commonly known as:  DILAUDID  Take 1 tablet (4 mg total) by mouth every 4 (four) hours as needed for severe pain.     mometasone 50 MCG/ACT nasal spray  Commonly known as:  NASONEX  Place 2 sprays into both nostrils 2 (two) times daily.     multivitamin Liqd  Take 5 mLs by mouth daily.     SENNA-S 8.6-50 MG per tablet  Generic drug:  senna-docusate  Take 4 tablets by mouth daily with supper.     temazepam 15 MG capsule  Commonly known as:  RESTORIL  Take 1 capsule by mouth at bedtime as needed for sleep.     Vitamin D 2000 UNITS tablet  Take 2,000 Units by mouth daily with supper.         SignedOphelia Charter 08/22/2014, 7:47 AM

## 2014-08-22 NOTE — Progress Notes (Signed)
Utilization review completed.  

## 2014-08-22 NOTE — Evaluation (Signed)
Physical Therapy Evaluation Patient Details Name: Patricia Reid MRN: [id number] DOB: 26-Nov-1940 Today's Date: 08/22/2014   History of Present Illness  Pt is a 74 year old female s/p L4-L5 PLIF. Pt with PMH for COPD, HTN, GERD.   Clinical Impression  Pt demonstrates appropriate, safe mobility while following back precautions. Pt educated on who to adjust brace, when to wear brace and how to maintain back precautions during ADLs and IADLs once home. Pt does not require further acute PT education or intervention and will not need PT at discharge.      Follow Up Recommendations No PT follow up, Supervision - Intermittent     Equipment Recommendations  None recommended by PT    Recommendations for Other Services       Precautions / Restrictions Precautions Precautions: Back;Fall Precaution Booklet Issued: Yes (comment) Precaution Comments: Provided and reviewed precaution handout Required Braces or Orthoses: Spinal Brace Spinal Brace: Lumbar corset Restrictions Weight Bearing Restrictions: No      Mobility  Bed Mobility Overal bed mobility: Needs Assistance Bed Mobility: Rolling;Sidelying to Sit;Sit to Sidelying Rolling: Supervision Sidelying to sit: Supervision     Sit to sidelying: Supervision General bed mobility comments: Pt familiar with log roll technique and able to demonstrate for PT while maintaining back precautions.   Transfers Overall transfer level: Needs assistance Equipment used: None Transfers: Sit to/from Stand Sit to Stand: Supervision         General transfer comment: Pt able to safely transfer from sitting on lowered bed to standing without physical assistance or cuing from PT. Pt without loss of balance. Able to maintain precautions throughout transition.   Ambulation/Gait Ambulation/Gait assistance: Supervision Ambulation Distance (Feet): 180 Feet Assistive device: None Gait Pattern/deviations: Step-through pattern;Decreased step length -  left;Decreased step length - right;Decreased stride length;Wide base of support   Gait velocity interpretation: Below normal speed for age/gender General Gait Details: Pt able to safely ambulate in hallway without loss of balance or need for support or assist. Pt ambulating slowly due to back pain but was not reaching for support.   Stairs            Wheelchair Mobility    Modified Rankin (Stroke Patients Only)       Balance Overall balance assessment: Needs assistance         Standing balance support: During functional activity;No upper extremity supported Standing balance-Leahy Scale: Fair Standing balance comment: Pt with good standing balance and able to ambulate safely without support; limited currently by back pain                             Pertinent Vitals/Pain Pain Assessment: 0-10 Pain Score: 6  Pain Location: Low back; incision Pain Descriptors / Indicators: Aching;Sore Pain Intervention(s): Limited activity within patient's tolerance;Monitored during session;Premedicated before session;Repositioned    Home Living Family/patient expects to be discharged to:: Private residence Living Arrangements: Alone Available Help at Discharge: Family;Friend(s) Type of Home: House Home Access: Stairs to enter Entrance Stairs-Rails: None Entrance Stairs-Number of Steps: 1 Home Layout: One level Home Equipment: Cane - single point      Prior Function Level of Independence: Independent         Comments: Retired Pharmacist, hospital; still drives     Hand Dominance   Dominant Hand: Right    Extremity/Trunk Assessment               Lower Extremity Assessment: Overall WFL for tasks  assessed         Communication   Communication: No difficulties  Cognition Arousal/Alertness: Awake/alert Behavior During Therapy: WFL for tasks assessed/performed Overall Cognitive Status: Within Functional Limits for tasks assessed                       General Comments  Pt able to don and doff brace independently without cuing or physical assistance. Able to reiterate when to wear brace.     Exercises        Assessment/Plan    PT Assessment Patent does not need any further PT services  PT Diagnosis Acute pain   PT Problem List    PT Treatment Interventions     PT Goals (Current goals can be found in the Care Plan section)      Frequency     Barriers to discharge        Co-evaluation               End of Session Equipment Utilized During Treatment: Gait belt Activity Tolerance: Patient tolerated treatment well;Patient limited by pain Patient left: in bed;with call bell/phone within reach Nurse Communication: Mobility status         Time: 0913-0940 PT Time Calculation (min) (ACUTE ONLY): 27 min   Charges:   PT Evaluation $Initial PT Evaluation Tier I: 1 Procedure PT Treatments $Gait Training: 8-22 mins   PT G CodesJearld Shines SPT 08/22/2014, 10:55 AM  Jearld Shines, SPT  Acute Rehabilitation [phone]

## 2014-08-23 MED FILL — Sodium Chloride IV Soln 0.9%: INTRAVENOUS | Qty: 1000 | Status: AC

## 2014-08-23 MED FILL — Heparin Sodium (Porcine) Inj 1000 Unit/ML: INTRAMUSCULAR | Qty: 30 | Status: AC

## 2014-09-14 ENCOUNTER — Encounter (HOSPITAL_COMMUNITY): Payer: Self-pay | Admitting: Neurosurgery

## 2014-11-14 ENCOUNTER — Ambulatory Visit
Admit: 2014-11-14 | Disposition: A | Discharge status: Home / Self Care | Payer: Self-pay | Attending: Internal Medicine | Admitting: Internal Medicine

## 2015-01-23 ENCOUNTER — Encounter: Payer: Self-pay | Admitting: *Deleted

## 2015-01-24 ENCOUNTER — Encounter: Payer: Self-pay | Admitting: Unknown Physician Specialty

## 2015-01-24 ENCOUNTER — Encounter
Admission: RE | Disposition: A | Discharge status: Home / Self Care | Payer: Self-pay | Source: Ambulatory Visit | Attending: Unknown Physician Specialty

## 2015-01-24 ENCOUNTER — Ambulatory Visit
Admission: RE | Admit: 2015-01-24 | Discharge: 2015-01-24 | Disposition: A | Discharge status: Home / Self Care | Payer: Medicare Other | Source: Ambulatory Visit | Attending: Unknown Physician Specialty | Admitting: Unknown Physician Specialty

## 2015-01-24 ENCOUNTER — Ambulatory Visit: Payer: Medicare Other | Admitting: *Deleted

## 2015-01-24 DIAGNOSIS — K2970 Gastritis, unspecified, without bleeding: Secondary | ICD-10-CM | POA: Diagnosis not present

## 2015-01-24 DIAGNOSIS — J449 Chronic obstructive pulmonary disease, unspecified: Secondary | ICD-10-CM | POA: Insufficient documentation

## 2015-01-24 DIAGNOSIS — F17210 Nicotine dependence, cigarettes, uncomplicated: Secondary | ICD-10-CM | POA: Diagnosis not present

## 2015-01-24 DIAGNOSIS — B3781 Candidal esophagitis: Secondary | ICD-10-CM | POA: Insufficient documentation

## 2015-01-24 DIAGNOSIS — K219 Gastro-esophageal reflux disease without esophagitis: Secondary | ICD-10-CM | POA: Insufficient documentation

## 2015-01-24 DIAGNOSIS — I10 Essential (primary) hypertension: Secondary | ICD-10-CM | POA: Insufficient documentation

## 2015-01-24 DIAGNOSIS — K648 Other hemorrhoids: Secondary | ICD-10-CM | POA: Insufficient documentation

## 2015-01-24 DIAGNOSIS — D120 Benign neoplasm of cecum: Secondary | ICD-10-CM | POA: Insufficient documentation

## 2015-01-24 DIAGNOSIS — D125 Benign neoplasm of sigmoid colon: Secondary | ICD-10-CM | POA: Insufficient documentation

## 2015-01-24 DIAGNOSIS — Z79899 Other long term (current) drug therapy: Secondary | ICD-10-CM | POA: Insufficient documentation

## 2015-01-24 DIAGNOSIS — Z1211 Encounter for screening for malignant neoplasm of colon: Secondary | ICD-10-CM | POA: Diagnosis not present

## 2015-01-24 DIAGNOSIS — R1310 Dysphagia, unspecified: Secondary | ICD-10-CM | POA: Diagnosis present

## 2015-01-24 DIAGNOSIS — K621 Rectal polyp: Secondary | ICD-10-CM | POA: Diagnosis not present

## 2015-01-24 DIAGNOSIS — Z79891 Long term (current) use of opiate analgesic: Secondary | ICD-10-CM | POA: Diagnosis not present

## 2015-01-24 DIAGNOSIS — Z86010 Personal history of colonic polyps: Secondary | ICD-10-CM | POA: Diagnosis not present

## 2015-01-24 HISTORY — PX: COLONOSCOPY WITH PROPOFOL: SHX5780

## 2015-01-24 HISTORY — PX: ESOPHAGOGASTRODUODENOSCOPY: SHX5428

## 2015-01-24 HISTORY — PX: SAVORY DILATION: SHX5439

## 2015-01-24 SURGERY — COLONOSCOPY WITH PROPOFOL
Anesthesia: General

## 2015-01-24 MED ORDER — ESMOLOL HCL 10 MG/ML IV SOLN
INTRAVENOUS | Status: DC | PRN
  Administered 2015-01-24: 20 mg via INTRAVENOUS

## 2015-01-24 MED ORDER — GLYCOPYRROLATE 0.2 MG/ML IJ SOLN
INTRAMUSCULAR | Status: DC | PRN
  Administered 2015-01-24: 0.2 mg via INTRAVENOUS

## 2015-01-24 MED ORDER — SODIUM CHLORIDE 0.9 % IV SOLN: INTRAVENOUS | Status: DC

## 2015-01-24 MED ORDER — LACTATED RINGERS IV SOLN
INTRAVENOUS | Status: DC
  Administered 2015-01-24: 1000 mL via INTRAVENOUS

## 2015-01-24 MED ORDER — MIDAZOLAM HCL 5 MG/5ML IJ SOLN
INTRAMUSCULAR | Status: DC | PRN
Start: 2015-01-24 — End: 2015-01-24
  Administered 2015-01-24: 1 mg via INTRAVENOUS

## 2015-01-24 MED ORDER — PROPOFOL 10 MG/ML IV BOLUS
INTRAVENOUS | Status: DC | PRN
  Administered 2015-01-24: 40 mg via INTRAVENOUS
  Administered 2015-01-24: 20 mg via INTRAVENOUS

## 2015-01-24 MED ORDER — LIDOCAINE HCL (PF) 2 % IJ SOLN
INTRAMUSCULAR | Status: DC | PRN
  Administered 2015-01-24: 40 mg

## 2015-01-24 MED ORDER — PIPERACILLIN-TAZOBACTAM 3.375 G IVPB 30 MIN
3.3750 g | Freq: Once | INTRAVENOUS | Status: AC
  Administered 2015-01-24: 3.375 g via INTRAVENOUS
  Filled 2015-01-24: qty 50

## 2015-01-24 MED ORDER — ONDANSETRON HCL 4 MG/2ML IJ SOLN
INTRAMUSCULAR | Status: DC | PRN
Start: 2015-01-24 — End: 2015-01-24
  Administered 2015-01-24: 4 mg via INTRAVENOUS

## 2015-01-24 MED ORDER — PROPOFOL INFUSION 10 MG/ML OPTIME
INTRAVENOUS | Status: DC | PRN
  Administered 2015-01-24: 50 ug/kg/min via INTRAVENOUS

## 2015-01-24 MED ORDER — FENTANYL CITRATE (PF) 100 MCG/2ML IJ SOLN
INTRAMUSCULAR | Status: DC | PRN
  Administered 2015-01-24: 50 ug via INTRAVENOUS

## 2015-01-24 MED ORDER — PHENYLEPHRINE HCL 10 MG/ML IJ SOLN
INTRAMUSCULAR | Status: DC | PRN
  Administered 2015-01-24 (×4): 100 ug via INTRAVENOUS

## 2015-01-24 NOTE — Anesthesia Preprocedure Evaluation (Signed)
Anesthesia Evaluation  Patient identified by MRN, date of birth, ID band Patient awake    Reviewed: Allergy & Precautions, NPO status , Patient's Chart, lab work & pertinent test results  Airway Mallampati: II  TM Distance: >3 FB Neck ROM: Limited    Dental  (+) Teeth Intact   Pulmonary COPD A pack per day or more for many years. breath sounds clear to auscultation  Pulmonary exam normal       Cardiovascular Exercise Tolerance: Good hypertension, Pt. on medications Normal cardiovascular examRhythm:Regular Rate:Normal     Neuro/Psych    GI/Hepatic   Endo/Other    Renal/GU      Musculoskeletal   Abdominal (+)  Abdomen: soft.    Peds  Hematology   Anesthesia Other Findings   Reproductive/Obstetrics                             Anesthesia Physical Anesthesia Plan  ASA: II  Anesthesia Plan: General   Post-op Pain Management:    Induction: Intravenous  Airway Management Planned: Simple Face Mask  Additional Equipment:   Intra-op Plan:   Post-operative Plan:   Informed Consent: I have reviewed the patients History and Physical, chart, labs and discussed the procedure including the risks, benefits and alternatives for the proposed anesthesia with the patient or authorized representative who has indicated his/her understanding and acceptance.     Plan Discussed with: CRNA  Anesthesia Plan Comments:         Anesthesia Quick Evaluation

## 2015-01-24 NOTE — H&P (Signed)
Primary Care Physician:  Adin Hector, MD Primary Gastroenterologist:  Dr. Vira Agar  Pre-Procedure History & Physical: HPI:  Patricia Reid is a 74 y.o. female is here for an endoscopy and colonoscopy.   Past Medical History  Diagnosis Date  . Hypertension   . GERD (gastroesophageal reflux disease)   . COPD (chronic obstructive pulmonary disease)     slim to none    Past Surgical History  Procedure Laterality Date  . Cholecystectomy    . Back surgery    . Tonsillectomy    . Colonoscopy      Prior to Admission medications   Medication Sig Start Date End Date Taking? Authorizing Provider  azelastine (ASTELIN) 0.1 % nasal spray Place 1 spray into both nostrils 2 (two) times daily. 07/18/14  Yes Historical Provider, MD  celecoxib (CELEBREX) 200 MG capsule Take 200 mg by mouth 2 (two) times daily.   Yes Historical Provider, MD  cholecalciferol (VITAMIN D) 1000 UNITS tablet Take 1,000 Units by mouth daily.   Yes Historical Provider, MD  diazepam (VALIUM) 5 MG tablet Take 1 tablet (5 mg total) by mouth every 6 (six) hours as needed for muscle spasms. 08/22/14  Yes Newman Pies, MD  docusate sodium 100 MG CAPS Take 100 mg by mouth 2 (two) times daily. 08/22/14  Yes Newman Pies, MD  esomeprazole (NEXIUM) 40 MG capsule Take 40 mg by mouth 2 (two) times daily before a meal.   Yes Historical Provider, MD  estradiol (ESTRACE) 0.1 MG/GM vaginal cream Place 8.09 Applicatorfuls vaginally 2 (two) times a week. Days of the week vary   Yes Historical Provider, MD  fluticasone (FLONASE) 50 MCG/ACT nasal spray Place 2 sprays into both nostrils daily.   Yes Historical Provider, MD  gabapentin (NEURONTIN) 300 MG capsule Take 300 mg by mouth 2 (two) times daily.  06/07/14  Yes Historical Provider, MD  loratadine (CLARITIN) 10 MG tablet Take 1 tablet by mouth daily as needed for allergies.    Yes Historical Provider, MD  Multiple Vitamin (MULTIVITAMIN) LIQD Take 5 mLs by mouth daily.   Yes  Historical Provider, MD  polyethylene glycol powder (GLYCOLAX/MIRALAX) powder Take 17 g by mouth daily.   Yes Historical Provider, MD  sucralfate (CARAFATE) 1 G tablet Take 1 g by mouth 4 (four) times daily -  with meals and at bedtime.   Yes Historical Provider, MD  temazepam (RESTORIL) 15 MG capsule Take 1 capsule by mouth at bedtime as needed for sleep.    Yes Historical Provider, MD  traMADol (ULTRAM) 50 MG tablet Take 25-50 mg by mouth every 6 (six) hours as needed for severe pain.   Yes Historical Provider, MD  Wheat Dextrin (BENEFIBER PO) Take 30 mLs by mouth 3 (three) times daily as needed (constipation). Mix in 8 oz liquid and drink   Yes Historical Provider, MD  hydrochlorothiazide (HYDRODIURIL) 25 MG tablet Take 12.5 mg by mouth daily.  05/31/14   Historical Provider, MD  HYDROmorphone (DILAUDID) 4 MG tablet Take 1 tablet (4 mg total) by mouth every 4 (four) hours as needed for severe pain. Patient not taking: Reported on 01/23/2015 08/22/14   Newman Pies, MD    Allergies as of 12/20/2014 - Review Complete 08/21/2014  Allergen Reaction Noted  . Codeine  08/14/2014    No family history on file.  History   Social History  . Marital Status: Divorced    Spouse Name: N/A  . Number of Children: N/A  . Years  of Education: N/A   Occupational History  . Not on file.   Social History Main Topics  . Smoking status: Current Every Day Smoker -- 1.00 packs/day for 50 years    Types: Cigarettes  . Smokeless tobacco: Not on file  . Alcohol Use: Yes     Comment: occasionally  . Drug Use: Not on file  . Sexual Activity: Not on file   Other Topics Concern  . Not on file   Social History Narrative    Review of Systems: See HPI, otherwise negative ROS  Physical Exam: BP 156/81 mmHg  Pulse 101  Temp(Src) 98.3 F (36.8 C) (Oral)  Ht 5' 5.5" (1.664 m)  Wt 63.504 kg (140 lb)  BMI 22.93 kg/m2  SpO2 100% General:   Alert,  pleasant and cooperative in NAD Head:   Normocephalic and atraumatic. Neck:  Supple; no masses or thyromegaly. Lungs:  Clear throughout to auscultation.    Heart:  Regular rate and rhythm. Abdomen:  Soft, nontender and nondistended. Normal bowel sounds, without guarding, and without rebound.   Neurologic:  Alert and  oriented x4;  grossly normal neurologically.  Impression/Plan: Patricia Reid is here for an endoscopy and colonoscopy to be performed for dysphagia and personal history of colon polyps  Risks, benefits, limitations, and alternatives regarding  endoscopy and colonoscopy have been reviewed with the patient.  Questions have been answered.  All parties agreeable.   Gaylyn Cheers, MD  01/24/2015, 8:56 AM

## 2015-01-24 NOTE — Transfer of Care (Signed)
Immediate Anesthesia Transfer of Care Note  Patient: Patricia Reid  Procedure(s) Performed: Procedure(s): COLONOSCOPY WITH PROPOFOL (N/A) ESOPHAGOGASTRODUODENOSCOPY (EGD) (N/A) SAVORY DILATION (N/A)  Patient Location: PACU  Anesthesia Type:General  Level of Consciousness: sedated  Airway & Oxygen Therapy: Patient Spontanous Breathing and Patient connected to nasal cannula oxygen  Post-op Assessment: Report given to RN and Post -op Vital signs reviewed and stable  Post vital signs: Reviewed  Last Vitals:  Filed Vitals:   01/24/15 0832  BP: 156/81  Pulse: 101  Temp: 39.4 C    Complications: No apparent anesthesia complications

## 2015-01-24 NOTE — OR Nursing (Signed)
Patient was dilated with 48 savory dilator, then a 51 savory dilator. Patient tolerated procedure well.

## 2015-01-24 NOTE — Anesthesia Postprocedure Evaluation (Signed)
  Anesthesia Post-op Note  Patient: Patricia Reid  Procedure(s) Performed: Procedure(s): COLONOSCOPY WITH PROPOFOL (N/A) ESOPHAGOGASTRODUODENOSCOPY (EGD) (N/A) SAVORY DILATION (N/A)  Anesthesia type:General  Patient location: PACU  Post pain: Pain level controlled  Post assessment: Post-op Vital signs reviewed, Patient's Cardiovascular Status Stable, Respiratory Function Stable, Patent Airway and No signs of Nausea or vomiting  Post vital signs: Reviewed and stable  Last Vitals:  Filed Vitals:   01/24/15 0950  BP: 93/75  Pulse: 66  Temp:   Resp: 15    Level of consciousness: awake, alert  and patient cooperative  Complications: No apparent anesthesia complications

## 2015-01-24 NOTE — Op Note (Signed)
[facility] Gastroenterology Patient Name: Patricia Reid Procedure Date: 01/24/2015 8:44 AM MRN: [id number] Account #: [id number] Date of Birth: 09-07-1940 Admit Type: Outpatient Age: 74 Room: [facility] ENDO ROOM 4 Gender: Female Note Status: Finalized Procedure:         Upper GI endoscopy Indications:       Dysphagia Providers:         Manya Silvas, MD Referring MD:      Ramonita Lab, MD (Referring MD) Medicines:         Propofol per Anesthesia Complications:     No immediate complications. Procedure:         Pre-Anesthesia Assessment:                    - After reviewing the risks and benefits, the patient was                     deemed in satisfactory condition to undergo the procedure.                    After obtaining informed consent, the endoscope was passed                     under direct vision. Throughout the procedure, the                     patient's blood pressure, pulse, and oxygen saturations                     were monitored continuously. The Olympus GIF-160 endoscope                     (S#. [id number]) was introduced through the mouth, and                     advanced to the second part of duodenum. The upper GI                     endoscopy was accomplished without difficulty. The patient                     tolerated the procedure well. Findings:      The examined esophagus was normal except for the yeast noted on the       mucosa. At the end of the procedure A guidewire was placed and the scope       was withdrawn. Dilation was performed with a Savary dilator with mild       resistance at 16 mm and 17 mm.      Diffuse mild inflammation characterized by erythema and granularity was       found in the gastric body.      The examined duodenum was normal.      Patchy candidiasis was found in the middle third of the esophagus and in       the lower third of the esophagus. Impression:        - Normal esophagus. Dilated.                    -  Gastritis.                    - Normal examined duodenum.                    - No specimens  collected. Recommendation:    - soft food for 3 days, eat slowly, chew well, take small                     bites, blood test for H. pylori.Nystatin for yeast                     infection. Diflucan if needed. Manya Silvas, MD 01/24/2015 9:13:19 AM This report has been signed electronically. Number of Addenda: 0 Note Initiated On: 01/24/2015 8:44 AM      [facility]

## 2015-01-25 LAB — SURGICAL PATHOLOGY

## 2015-01-29 NOTE — Op Note (Signed)
[facility] Gastroenterology Patient Name: Patricia Reid Procedure Date: 01/24/2015 8:40 AM MRN: [id number] Account #: [id number] Date of Birth: 23-Oct-1940 Admit Type: Outpatient Age: 74 Room: [facility] ENDO ROOM 4 Gender: Female Note Status: Finalized Procedure:         Colonoscopy Indications:       Follow-up for history of adenomatous polyps in the colon Providers:         Manya Silvas, MD Referring MD:      Ramonita Lab, MD (Referring MD) Medicines:         Propofol per Anesthesia Complications:     No immediate complications. Procedure:         Pre-Anesthesia Assessment:                    - After reviewing the risks and benefits, the patient was                     deemed in satisfactory condition to undergo the procedure.                    After obtaining informed consent, the colonoscope was                     passed under direct vision. Throughout the procedure, the                     patient's blood pressure, pulse, and oxygen saturations                     were monitored continuously. The Olympus PCF-160AL                     Colonoscope (S# [id number]) was introduced through the anus                     and advanced to the the cecum, identified by appendiceal                     orifice and ileocecal valve. The colonoscopy was performed                     without difficulty. The patient tolerated the procedure                     well. The quality of the bowel preparation was excellent. Findings:      A small polyp was found in the cecum. The polyp was sessile. The polyp       was removed with a cold snare. Resection and retrieval were complete.      Two sessile polyps were found in the recto-sigmoid colon. The polyps       were diminutive in size. These polyps were removed with a jumbo cold       forceps. Resection and retrieval were complete.      Five sessile polyps were found in the rectum. The polyps were diminutive       in size. These polyps  were removed with a jumbo cold forceps. Resection       and retrieval were complete.      Internal hemorrhoids were found during endoscopy. The hemorrhoids were       small and Grade I (internal hemorrhoids that do not prolapse). Impression:        - One small polyp in the cecum. Resected  and retrieved.                    - Two diminutive polyps at the recto-sigmoid colon.                     Resected and retrieved.                    - Five diminutive polyps in the rectum. Resected and                     retrieved.                    - Internal hemorrhoids. Recommendation:    - Await pathology results. Manya Silvas, MD 01/29/2015 10:24:17 AM Number of Addenda: 0 Note Initiated On: 01/24/2015 8:40 AM Scope Withdrawal Time: 0 hours 24 minutes 50 seconds  Total Procedure Duration: 0 hours 30 minutes 58 seconds       [facility]

## 2015-03-05 DIAGNOSIS — M5030 Other cervical disc degeneration, unspecified cervical region: Secondary | ICD-10-CM | POA: Insufficient documentation

## 2015-04-23 ENCOUNTER — Other Ambulatory Visit: Payer: Self-pay | Admitting: Physical Medicine and Rehabilitation

## 2015-04-23 DIAGNOSIS — M5412 Radiculopathy, cervical region: Secondary | ICD-10-CM

## 2015-05-08 ENCOUNTER — Ambulatory Visit
Admission: RE | Admit: 2015-05-08 | Discharge: 2015-05-08 | Disposition: A | Discharge status: Home / Self Care | Payer: Medicare Other | Source: Ambulatory Visit | Attending: Physical Medicine and Rehabilitation | Admitting: Physical Medicine and Rehabilitation

## 2015-05-08 DIAGNOSIS — M5412 Radiculopathy, cervical region: Secondary | ICD-10-CM | POA: Diagnosis present

## 2015-05-08 DIAGNOSIS — M4722 Other spondylosis with radiculopathy, cervical region: Secondary | ICD-10-CM | POA: Insufficient documentation

## 2015-05-08 DIAGNOSIS — M4802 Spinal stenosis, cervical region: Secondary | ICD-10-CM | POA: Insufficient documentation

## 2015-05-28 DIAGNOSIS — M7062 Trochanteric bursitis, left hip: Secondary | ICD-10-CM | POA: Insufficient documentation

## 2016-04-08 ENCOUNTER — Other Ambulatory Visit: Payer: Self-pay | Admitting: Obstetrics and Gynecology

## 2016-04-08 DIAGNOSIS — Z1231 Encounter for screening mammogram for malignant neoplasm of breast: Secondary | ICD-10-CM

## 2016-04-16 DIAGNOSIS — M791 Myalgia, unspecified site: Secondary | ICD-10-CM | POA: Insufficient documentation

## 2016-04-22 ENCOUNTER — Telehealth: Payer: Self-pay | Admitting: *Deleted

## 2016-04-22 NOTE — Telephone Encounter (Signed)
Received referral for initial lung cancer screening scan. Contacted patient and obtained smoking history,(current, 50 pack year) as well as answering questions related to screening process. Patient denies signs of lung cancer such as weight loss or hemoptysis. Patient denies comorbidity that would prevent curative treatment if lung cancer were found. Patient is tentatively scheduled for shared decision making visit and CT scan on 04/29/16 at 1:30pm, pending insurance approval from business office.

## 2016-04-22 NOTE — Telephone Encounter (Signed)
Received referral for low dose lung cancer screening CT scan. Voicemail left at phone number listed in EMR for patient to call me back to facilitate scheduling scan.  

## 2016-04-28 ENCOUNTER — Telehealth: Payer: Self-pay | Admitting: *Deleted

## 2016-04-28 ENCOUNTER — Ambulatory Visit: Payer: Medicare Other

## 2016-04-28 NOTE — Telephone Encounter (Signed)
Patient called to reschedule lung cancer screening scan. Will be rescheduled to 05/13/16 at 1:30pm.

## 2016-04-29 ENCOUNTER — Other Ambulatory Visit: Payer: Self-pay | Admitting: Internal Medicine

## 2016-04-30 ENCOUNTER — Other Ambulatory Visit: Payer: Self-pay | Admitting: Internal Medicine

## 2016-04-30 DIAGNOSIS — I739 Peripheral vascular disease, unspecified: Secondary | ICD-10-CM

## 2016-04-30 DIAGNOSIS — R6889 Other general symptoms and signs: Secondary | ICD-10-CM

## 2016-05-06 ENCOUNTER — Ambulatory Visit
Admission: RE | Admit: 2016-05-06 | Discharge: 2016-05-06 | Disposition: A | Discharge status: Home / Self Care | Payer: Medicare Other | Source: Ambulatory Visit | Attending: Obstetrics and Gynecology | Admitting: Obstetrics and Gynecology

## 2016-05-06 ENCOUNTER — Other Ambulatory Visit: Payer: Self-pay | Admitting: Obstetrics and Gynecology

## 2016-05-06 DIAGNOSIS — Z1231 Encounter for screening mammogram for malignant neoplasm of breast: Secondary | ICD-10-CM | POA: Diagnosis not present

## 2016-05-12 ENCOUNTER — Ambulatory Visit
Admission: RE | Admit: 2016-05-12 | Discharge: 2016-05-12 | Disposition: A | Discharge status: Home / Self Care | Payer: Medicare Other | Source: Ambulatory Visit | Attending: Internal Medicine | Admitting: Internal Medicine

## 2016-05-12 ENCOUNTER — Other Ambulatory Visit: Payer: Self-pay | Admitting: *Deleted

## 2016-05-12 ENCOUNTER — Other Ambulatory Visit: Payer: Self-pay | Admitting: Internal Medicine

## 2016-05-12 ENCOUNTER — Ambulatory Visit: Payer: Medicare Other

## 2016-05-12 DIAGNOSIS — R6889 Other general symptoms and signs: Secondary | ICD-10-CM

## 2016-05-12 DIAGNOSIS — I739 Peripheral vascular disease, unspecified: Secondary | ICD-10-CM | POA: Diagnosis present

## 2016-05-12 DIAGNOSIS — I77811 Abdominal aortic ectasia: Secondary | ICD-10-CM | POA: Diagnosis not present

## 2016-05-12 DIAGNOSIS — Z87891 Personal history of nicotine dependence: Secondary | ICD-10-CM

## 2016-05-12 MED ORDER — IOPAMIDOL (ISOVUE-370) INJECTION 76%
125.0000 mL | Freq: Once | INTRAVENOUS | Status: AC | PRN
  Administered 2016-05-12: 125 mL via INTRAVENOUS

## 2016-05-13 ENCOUNTER — Inpatient Hospital Stay: Payer: Medicare Other | Admitting: Oncology

## 2016-05-13 ENCOUNTER — Ambulatory Visit: Payer: Medicare Other | Admitting: Oncology

## 2016-05-20 ENCOUNTER — Inpatient Hospital Stay: Payer: Medicare Other | Attending: Oncology | Admitting: Oncology

## 2016-05-20 ENCOUNTER — Ambulatory Visit
Admission: RE | Admit: 2016-05-20 | Discharge: 2016-05-20 | Disposition: A | Discharge status: Home / Self Care | Payer: Medicare Other | Source: Ambulatory Visit | Attending: Oncology | Admitting: Oncology

## 2016-05-20 DIAGNOSIS — I2510 Atherosclerotic heart disease of native coronary artery without angina pectoris: Secondary | ICD-10-CM | POA: Insufficient documentation

## 2016-05-20 DIAGNOSIS — Z87891 Personal history of nicotine dependence: Secondary | ICD-10-CM

## 2016-05-20 DIAGNOSIS — Z122 Encounter for screening for malignant neoplasm of respiratory organs: Secondary | ICD-10-CM

## 2016-05-20 DIAGNOSIS — I700 Atherosclerosis of aorta: Secondary | ICD-10-CM | POA: Diagnosis not present

## 2016-05-22 ENCOUNTER — Telehealth: Payer: Self-pay | Admitting: *Deleted

## 2016-05-22 NOTE — Telephone Encounter (Signed)
Notified patient of LDCT lung cancer screening results with recommendation for 12 month follow up imaging. Also notified of incidental finding noted below. Patient verbalizes understanding. A copy of this note will be sent to patient's PCP.  IMPRESSION: 1. Lung-RADS Category 1, negative. Continue annual screening with low-dose chest CT without contrast in 12 months. 2.  Coronary artery atherosclerosis. Aortic atherosclerosis.

## 2016-05-24 DIAGNOSIS — Z87891 Personal history of nicotine dependence: Secondary | ICD-10-CM | POA: Insufficient documentation

## 2016-05-24 NOTE — Progress Notes (Signed)
In accordance with CMS guidelines, patient has met eligibility criteria including age, absence of signs or symptoms of lung cancer.  Social History  Substance Use Topics  . Smoking status: Current Every Day Smoker    Packs/day: 1.00    Years: 50.00    Types: Cigarettes  . Smokeless tobacco: Not on file  . Alcohol use Yes     Comment: occasionally     A shared decision-making session was conducted prior to the performance of CT scan. This includes one or more decision aids, includes benefits and harms of screening, follow-up diagnostic testing, over-diagnosis, false positive rate, and total radiation exposure.  Counseling on the importance of adherence to annual lung cancer LDCT screening, impact of co-morbidities, and ability or willingness to undergo diagnosis and treatment is imperative for compliance of the program.  Counseling on the importance of continued smoking cessation for former smokers; the importance of smoking cessation for current smokers, and information about tobacco cessation interventions have been given to patient including [place] and 1800 quit  programs.  Written order for lung cancer screening with LDCT has been given to the patient and any and all questions have been answered to the best of my abilities.   Yearly follow up will be coordinated by Burgess Estelle, Thoracic Navigator.

## 2016-09-24 ENCOUNTER — Other Ambulatory Visit: Payer: Self-pay | Admitting: Physical Medicine and Rehabilitation

## 2016-09-24 DIAGNOSIS — M5416 Radiculopathy, lumbar region: Secondary | ICD-10-CM

## 2016-09-24 DIAGNOSIS — M48062 Spinal stenosis, lumbar region with neurogenic claudication: Secondary | ICD-10-CM

## 2016-09-24 DIAGNOSIS — M5136 Other intervertebral disc degeneration, lumbar region: Secondary | ICD-10-CM

## 2016-10-06 ENCOUNTER — Ambulatory Visit
Admission: RE | Admit: 2016-10-06 | Discharge: 2016-10-06 | Disposition: A | Discharge status: Home / Self Care | Payer: Medicare Other | Source: Ambulatory Visit | Attending: Physical Medicine and Rehabilitation | Admitting: Physical Medicine and Rehabilitation

## 2016-10-06 DIAGNOSIS — M5136 Other intervertebral disc degeneration, lumbar region: Secondary | ICD-10-CM

## 2016-10-06 DIAGNOSIS — Z981 Arthrodesis status: Secondary | ICD-10-CM | POA: Diagnosis not present

## 2016-10-06 DIAGNOSIS — M48062 Spinal stenosis, lumbar region with neurogenic claudication: Secondary | ICD-10-CM

## 2016-10-06 DIAGNOSIS — G9619 Other disorders of meninges, not elsewhere classified: Secondary | ICD-10-CM | POA: Insufficient documentation

## 2016-10-06 DIAGNOSIS — M48061 Spinal stenosis, lumbar region without neurogenic claudication: Secondary | ICD-10-CM | POA: Diagnosis not present

## 2016-10-06 DIAGNOSIS — M5126 Other intervertebral disc displacement, lumbar region: Secondary | ICD-10-CM | POA: Diagnosis not present

## 2016-10-06 DIAGNOSIS — M5416 Radiculopathy, lumbar region: Secondary | ICD-10-CM

## 2016-10-06 DIAGNOSIS — M51369 Other intervertebral disc degeneration, lumbar region without mention of lumbar back pain or lower extremity pain: Secondary | ICD-10-CM

## 2016-12-09 ENCOUNTER — Other Ambulatory Visit: Payer: Self-pay | Admitting: Neurosurgery

## 2016-12-22 NOTE — Pre-Procedure Instructions (Signed)
Patricia Reid  12/22/2016      [id number] [address], [place] [address] [place] [address] Phone: [phone] Fax: [phone]    Your procedure is scheduled on Wed. May 23  Report to [facility] Admitting at 12:10 P.M.  Call this number if you have problems the morning of surgery:  [phone]   Remember:  Do not eat food or drink liquids after midnight on Tues. May 22   Take these medicines the morning of surgery with A SIP OF WATER : nasal spray, nexium, flonase nasal spray, dilaudid or tramadol if needed, claritin if needed             1 week prior to surgery stop: aspirin, advil, motrin, ibuprofen, aleve, celebrex, BC Powders, Goody's, vitamins/herbal medicines.   Do not wear jewelry, make-up or nail polish.  Do not wear lotions, powders, or perfumes, or deoderant.  Do not shave 48 hours prior to surgery.  Men may shave face and neck.  Do not bring valuables to the hospital.  [facility] is not responsible for any belongings or valuables.  Contacts, dentures or bridgework may not be worn into surgery.  Leave your suitcase in the car.  After surgery it may be brought to your room.  For patients admitted to the hospital, discharge time will be determined by your treatment team.  Patients discharged the day of surgery will not be allowed to drive home.    Special instructions:   [place]- Preparing For Surgery  Before surgery, you can play an important role. Because skin is not sterile, your skin needs to be as free of germs as possible. You can reduce the number of germs on your skin by washing with CHG (chlorahexidine gluconate) Soap before surgery.  CHG is an antiseptic cleaner which kills germs and bonds with the skin to continue killing germs even after washing.  Please do not use if you have an allergy to CHG or antibacterial soaps. If your skin becomes reddened/irritated stop using the CHG.  Do not shave (including  legs and underarms) for at least 48 hours prior to first CHG shower. It is OK to shave your face.  Please follow these instructions carefully.   1. Shower the NIGHT BEFORE SURGERY and the MORNING OF SURGERY with CHG.   2. If you chose to wash your hair, wash your hair first as usual with your normal shampoo.  3. After you shampoo, rinse your hair and body thoroughly to remove the shampoo.  4. Use CHG as you would any other liquid soap. You can apply CHG directly to the skin and wash gently with a scrungie or a clean washcloth.   5. Apply the CHG Soap to your body ONLY FROM THE NECK DOWN.  Do not use on open wounds or open sores. Avoid contact with your eyes, ears, mouth and genitals (private parts). Wash genitals (private parts) with your normal soap.  6. Wash thoroughly, paying special attention to the area where your surgery will be performed.  7. Thoroughly rinse your body with warm water from the neck down.  8. DO NOT shower/wash with your normal soap after using and rinsing off the CHG Soap.  9. Pat yourself dry with a CLEAN TOWEL.   10. Wear CLEAN PAJAMAS   11. Place CLEAN SHEETS on your bed the night of your first shower and DO NOT SLEEP WITH PETS.    Day of  Surgery: Do not apply any deodorants/lotions. Please wear clean clothes to the hospital/surgery center.      Please read over the following fact sheets that you were given. Coughing and Deep Breathing, MRSA Information and Surgical Site Infection Prevention

## 2016-12-23 ENCOUNTER — Encounter (HOSPITAL_COMMUNITY)
Admission: RE | Admit: 2016-12-23 | Discharge: 2016-12-23 | Disposition: A | Discharge status: Home / Self Care | Payer: Medicare Other | Source: Ambulatory Visit | Attending: Neurosurgery | Admitting: Neurosurgery

## 2016-12-23 ENCOUNTER — Encounter (HOSPITAL_COMMUNITY): Payer: Self-pay

## 2016-12-23 DIAGNOSIS — Z0183 Encounter for blood typing: Secondary | ICD-10-CM | POA: Diagnosis not present

## 2016-12-23 DIAGNOSIS — Z01818 Encounter for other preprocedural examination: Secondary | ICD-10-CM | POA: Diagnosis not present

## 2016-12-23 DIAGNOSIS — Z01812 Encounter for preprocedural laboratory examination: Secondary | ICD-10-CM | POA: Insufficient documentation

## 2016-12-23 DIAGNOSIS — M48061 Spinal stenosis, lumbar region without neurogenic claudication: Secondary | ICD-10-CM | POA: Diagnosis not present

## 2016-12-23 HISTORY — DX: Unspecified osteoarthritis, unspecified site: M19.90

## 2016-12-23 LAB — BASIC METABOLIC PANEL
Anion gap: 8 (ref 5–15)
BUN: 13 mg/dL (ref 6–20)
CALCIUM: 9.2 mg/dL (ref 8.9–10.3)
CO2: 28 mmol/L (ref 22–32)
Chloride: 97 mmol/L — ABNORMAL LOW (ref 101–111)
Creatinine, Ser: 0.9 mg/dL (ref 0.44–1.00)
GFR calc Af Amer: >60 mL/min (ref >60)
GFR calc non Af Amer: >60 mL/min (ref >60)
Glucose, Bld: 100 mg/dL — ABNORMAL HIGH (ref 65–99)
POTASSIUM: 3.9 mmol/L (ref 3.5–5.1)
SODIUM: 133 mmol/L — AB (ref 135–145)

## 2016-12-23 LAB — CBC
HEMATOCRIT: 35.1 % — AB (ref 36.0–46.0)
Hemoglobin: 11 g/dL — ABNORMAL LOW (ref 12.0–15.0)
MCH: 24.7 pg — ABNORMAL LOW (ref 26.0–34.0)
MCHC: 31.3 g/dL (ref 30.0–36.0)
MCV: 78.7 fL (ref 78.0–100.0)
Platelets: 260 10*3/uL (ref 150–400)
RBC: 4.46 MIL/uL (ref 3.87–5.11)
RDW: 16.6 % — AB (ref 11.5–15.5)
WBC: 7.1 10*3/uL (ref 4.0–10.5)

## 2016-12-23 LAB — SURGICAL PCR SCREEN
MRSA, PCR: NEGATIVE
Staphylococcus aureus: NEGATIVE

## 2016-12-23 LAB — TYPE AND SCREEN
ABO/RH(D): O POS
Antibody Screen: NEGATIVE

## 2016-12-23 NOTE — Progress Notes (Signed)
PCP: Dr. Ramonita Lab @ [facility] in [facility]

## 2016-12-31 ENCOUNTER — Inpatient Hospital Stay (HOSPITAL_COMMUNITY): Payer: Medicare Other | Admitting: Certified Registered Nurse Anesthetist

## 2016-12-31 ENCOUNTER — Inpatient Hospital Stay (HOSPITAL_COMMUNITY): Payer: Medicare Other

## 2016-12-31 ENCOUNTER — Inpatient Hospital Stay (HOSPITAL_COMMUNITY)
Admission: RE | Admit: 2016-12-31 | Discharge: 2017-01-01 | DRG: 460 | Disposition: A | Discharge status: Home / Self Care | Payer: Medicare Other | Source: Ambulatory Visit | Attending: Neurosurgery | Admitting: Neurosurgery

## 2016-12-31 ENCOUNTER — Inpatient Hospital Stay (HOSPITAL_COMMUNITY)
Admission: RE | Disposition: A | Discharge status: Home / Self Care | Payer: Self-pay | Source: Ambulatory Visit | Attending: Neurosurgery

## 2016-12-31 ENCOUNTER — Encounter (HOSPITAL_COMMUNITY): Payer: Self-pay | Admitting: *Deleted

## 2016-12-31 DIAGNOSIS — M5116 Intervertebral disc disorders with radiculopathy, lumbar region: Principal | ICD-10-CM | POA: Diagnosis present

## 2016-12-31 DIAGNOSIS — M48062 Spinal stenosis, lumbar region with neurogenic claudication: Secondary | ICD-10-CM | POA: Diagnosis present

## 2016-12-31 DIAGNOSIS — F17210 Nicotine dependence, cigarettes, uncomplicated: Secondary | ICD-10-CM | POA: Diagnosis present

## 2016-12-31 DIAGNOSIS — Z79899 Other long term (current) drug therapy: Secondary | ICD-10-CM

## 2016-12-31 DIAGNOSIS — Z419 Encounter for procedure for purposes other than remedying health state, unspecified: Secondary | ICD-10-CM

## 2016-12-31 DIAGNOSIS — M4686 Other specified inflammatory spondylopathies, lumbar region: Secondary | ICD-10-CM | POA: Diagnosis present

## 2016-12-31 DIAGNOSIS — J449 Chronic obstructive pulmonary disease, unspecified: Secondary | ICD-10-CM | POA: Diagnosis present

## 2016-12-31 DIAGNOSIS — K219 Gastro-esophageal reflux disease without esophagitis: Secondary | ICD-10-CM | POA: Diagnosis present

## 2016-12-31 DIAGNOSIS — I10 Essential (primary) hypertension: Secondary | ICD-10-CM | POA: Diagnosis present

## 2016-12-31 SURGERY — POSTERIOR LUMBAR FUSION 1 LEVEL
Anesthesia: General | Site: Back

## 2016-12-31 MED ORDER — DOCUSATE SODIUM 100 MG PO CAPS
100.0000 mg | ORAL_CAPSULE | Freq: Two times a day (BID) | ORAL | Status: DC
  Administered 2016-12-31 – 2017-01-01 (×2): 100 mg via ORAL
  Filled 2016-12-31 (×2): qty 1

## 2016-12-31 MED ORDER — ADULT MULTIVITAMIN W/MINERALS CH
1.0000 | ORAL_TABLET | Freq: Every day | ORAL | Status: DC
  Administered 2017-01-01: 1 via ORAL
  Filled 2016-12-31: qty 1

## 2016-12-31 MED ORDER — SODIUM CHLORIDE 0.9% FLUSH
3.0000 mL | Freq: Two times a day (BID) | INTRAVENOUS | Status: DC
  Administered 2016-12-31: 3 mL via INTRAVENOUS

## 2016-12-31 MED ORDER — GABAPENTIN 300 MG PO CAPS
300.0000 mg | ORAL_CAPSULE | Freq: Three times a day (TID) | ORAL | Status: DC
  Administered 2016-12-31 – 2017-01-01 (×2): 300 mg via ORAL
  Filled 2016-12-31 (×2): qty 1

## 2016-12-31 MED ORDER — BACITRACIN ZINC 500 UNIT/GM EX OINT
TOPICAL_OINTMENT | CUTANEOUS | Status: AC
  Filled 2016-12-31: qty 28.35

## 2016-12-31 MED ORDER — LIDOCAINE-EPINEPHRINE 1 %-1:100000 IJ SOLN
INTRAMUSCULAR | Status: DC | PRN
  Administered 2016-12-31: 10 mL

## 2016-12-31 MED ORDER — SODIUM CHLORIDE 0.9 % IV SOLN
250.0000 mL | INTRAVENOUS | Status: DC
  Administered 2016-12-31: 250 mL via INTRAVENOUS

## 2016-12-31 MED ORDER — LIDOCAINE-EPINEPHRINE 1 %-1:100000 IJ SOLN
INTRAMUSCULAR | Status: AC
  Filled 2016-12-31: qty 1

## 2016-12-31 MED ORDER — ACETAMINOPHEN 650 MG RE SUPP: 650.0000 mg | RECTAL | Status: DC | PRN

## 2016-12-31 MED ORDER — BUPIVACAINE LIPOSOME 1.3 % IJ SUSP
20.0000 mL | INTRAMUSCULAR | Status: DC
  Filled 2016-12-31: qty 20

## 2016-12-31 MED ORDER — ROCURONIUM BROMIDE 100 MG/10ML IV SOLN
INTRAVENOUS | Status: DC | PRN
  Administered 2016-12-31: 10 mg via INTRAVENOUS
  Administered 2016-12-31: 20 mg via INTRAVENOUS
  Administered 2016-12-31: 50 mg via INTRAVENOUS

## 2016-12-31 MED ORDER — SUGAMMADEX SODIUM 200 MG/2ML IV SOLN
INTRAVENOUS | Status: DC | PRN
  Administered 2016-12-31: 130 mg via INTRAVENOUS

## 2016-12-31 MED ORDER — PHENYLEPHRINE HCL 10 MG/ML IJ SOLN
INTRAMUSCULAR | Status: DC | PRN
  Administered 2016-12-31 (×2): 80 ug via INTRAVENOUS

## 2016-12-31 MED ORDER — TEMAZEPAM 7.5 MG PO CAPS: 15.0000 mg | ORAL_CAPSULE | Freq: Every evening | ORAL | Status: DC | PRN

## 2016-12-31 MED ORDER — HYDROMORPHONE HCL 1 MG/ML IJ SOLN
INTRAMUSCULAR | Status: AC
  Filled 2016-12-31: qty 0.5

## 2016-12-31 MED ORDER — PHENYLEPHRINE HCL 10 MG/ML IJ SOLN
INTRAVENOUS | Status: DC | PRN
  Administered 2016-12-31: 50 ug/min via INTRAVENOUS

## 2016-12-31 MED ORDER — ZOLPIDEM TARTRATE 5 MG PO TABS: 5.0000 mg | ORAL_TABLET | Freq: Every evening | ORAL | Status: DC | PRN

## 2016-12-31 MED ORDER — BISACODYL 10 MG RE SUPP: 10.0000 mg | Freq: Every day | RECTAL | Status: DC | PRN

## 2016-12-31 MED ORDER — MENTHOL 3 MG MT LOZG: 1.0000 | LOZENGE | OROMUCOSAL | Status: DC | PRN

## 2016-12-31 MED ORDER — LORATADINE 10 MG PO TABS: 10.0000 mg | ORAL_TABLET | Freq: Every day | ORAL | Status: DC | PRN

## 2016-12-31 MED ORDER — PROPOFOL 10 MG/ML IV BOLUS
INTRAVENOUS | Status: DC | PRN
  Administered 2016-12-31: 100 mg via INTRAVENOUS
  Administered 2016-12-31: 50 mg via INTRAVENOUS

## 2016-12-31 MED ORDER — HYDRALAZINE HCL 20 MG/ML IJ SOLN
INTRAMUSCULAR | Status: DC | PRN
  Administered 2016-12-31: 5 mg via INTRAVENOUS

## 2016-12-31 MED ORDER — PHENYLEPHRINE 40 MCG/ML (10ML) SYRINGE FOR IV PUSH (FOR BLOOD PRESSURE SUPPORT)
PREFILLED_SYRINGE | INTRAVENOUS | Status: AC
  Filled 2016-12-31: qty 10

## 2016-12-31 MED ORDER — VANCOMYCIN HCL 1000 MG IV SOLR
INTRAVENOUS | Status: DC | PRN
  Administered 2016-12-31: 1000 mg via TOPICAL

## 2016-12-31 MED ORDER — THROMBIN 5000 UNITS EX SOLR
OROMUCOSAL | Status: DC | PRN
  Administered 2016-12-31: 17:00:00 via TOPICAL

## 2016-12-31 MED ORDER — ONDANSETRON HCL 4 MG/2ML IJ SOLN
INTRAMUSCULAR | Status: AC
  Filled 2016-12-31: qty 2

## 2016-12-31 MED ORDER — BACITRACIN ZINC 500 UNIT/GM EX OINT
TOPICAL_OINTMENT | CUTANEOUS | Status: DC | PRN
  Administered 2016-12-31: 1 via TOPICAL

## 2016-12-31 MED ORDER — ONDANSETRON HCL 4 MG/2ML IJ SOLN
INTRAMUSCULAR | Status: DC | PRN
  Administered 2016-12-31: 4 mg via INTRAVENOUS

## 2016-12-31 MED ORDER — VANCOMYCIN HCL 1000 MG IV SOLR
INTRAVENOUS | Status: AC
  Filled 2016-12-31: qty 1000

## 2016-12-31 MED ORDER — ONDANSETRON HCL 4 MG PO TABS: 4.0000 mg | ORAL_TABLET | Freq: Four times a day (QID) | ORAL | Status: DC | PRN

## 2016-12-31 MED ORDER — BUPIVACAINE LIPOSOME 1.3 % IJ SUSP
INTRAMUSCULAR | Status: DC | PRN
  Administered 2016-12-31: 20 mL

## 2016-12-31 MED ORDER — ONDANSETRON HCL 4 MG/2ML IJ SOLN: 4.0000 mg | Freq: Four times a day (QID) | INTRAMUSCULAR | Status: DC | PRN

## 2016-12-31 MED ORDER — MEPERIDINE HCL 25 MG/ML IJ SOLN: 6.2500 mg | INTRAMUSCULAR | Status: DC | PRN

## 2016-12-31 MED ORDER — FLUTICASONE PROPIONATE 50 MCG/ACT NA SUSP
2.0000 | Freq: Two times a day (BID) | NASAL | Status: DC
  Administered 2016-12-31 – 2017-01-01 (×2): 2 via NASAL
  Filled 2016-12-31: qty 16

## 2016-12-31 MED ORDER — LIDOCAINE HCL (CARDIAC) 20 MG/ML IV SOLN
INTRAVENOUS | Status: DC | PRN
  Administered 2016-12-31: 60 mg via INTRAVENOUS

## 2016-12-31 MED ORDER — HYDRALAZINE HCL 20 MG/ML IJ SOLN
INTRAMUSCULAR | Status: AC
  Filled 2016-12-31: qty 1

## 2016-12-31 MED ORDER — CYCLOBENZAPRINE HCL 10 MG PO TABS
ORAL_TABLET | ORAL | Status: AC
  Filled 2016-12-31: qty 1

## 2016-12-31 MED ORDER — VITAMIN D 1000 UNITS PO TABS
1000.0000 [IU] | ORAL_TABLET | Freq: Every day | ORAL | Status: DC
  Administered 2017-01-01: 1000 [IU] via ORAL
  Filled 2016-12-31: qty 1

## 2016-12-31 MED ORDER — HYDROMORPHONE HCL 1 MG/ML IJ SOLN
0.2500 mg | INTRAMUSCULAR | Status: DC | PRN
  Administered 2016-12-31 (×4): 0.5 mg via INTRAVENOUS

## 2016-12-31 MED ORDER — FENTANYL CITRATE (PF) 250 MCG/5ML IJ SOLN
INTRAMUSCULAR | Status: AC
  Filled 2016-12-31: qty 5

## 2016-12-31 MED ORDER — PANTOPRAZOLE SODIUM 40 MG PO TBEC
80.0000 mg | DELAYED_RELEASE_TABLET | Freq: Every day | ORAL | Status: DC
  Administered 2017-01-01: 80 mg via ORAL
  Filled 2016-12-31: qty 2

## 2016-12-31 MED ORDER — HYDROCHLOROTHIAZIDE 25 MG PO TABS
12.5000 mg | ORAL_TABLET | Freq: Every day | ORAL | Status: DC
  Administered 2017-01-01: 12.5 mg via ORAL
  Filled 2016-12-31: qty 1

## 2016-12-31 MED ORDER — SODIUM CHLORIDE 0.9 % IR SOLN
Status: DC | PRN
  Administered 2016-12-31: 17:00:00

## 2016-12-31 MED ORDER — MORPHINE SULFATE (PF) 4 MG/ML IV SOLN
4.0000 mg | INTRAVENOUS | Status: DC | PRN
  Administered 2016-12-31 – 2017-01-01 (×5): 4 mg via INTRAVENOUS
  Filled 2016-12-31 (×5): qty 1

## 2016-12-31 MED ORDER — PHENOL 1.4 % MT LIQD: 1.0000 | OROMUCOSAL | Status: DC | PRN

## 2016-12-31 MED ORDER — THROMBIN 5000 UNITS EX SOLR
CUTANEOUS | Status: AC
  Filled 2016-12-31: qty 5000

## 2016-12-31 MED ORDER — THROMBIN 20000 UNITS EX SOLR
CUTANEOUS | Status: AC
  Filled 2016-12-31: qty 20000

## 2016-12-31 MED ORDER — SUGAMMADEX SODIUM 200 MG/2ML IV SOLN
INTRAVENOUS | Status: AC
  Filled 2016-12-31: qty 2

## 2016-12-31 MED ORDER — ROCURONIUM BROMIDE 10 MG/ML (PF) SYRINGE
PREFILLED_SYRINGE | INTRAVENOUS | Status: AC
  Filled 2016-12-31: qty 5

## 2016-12-31 MED ORDER — ONDANSETRON HCL 4 MG/2ML IJ SOLN
4.0000 mg | Freq: Once | INTRAMUSCULAR | Status: AC | PRN
  Administered 2016-12-31: 4 mg via INTRAVENOUS

## 2016-12-31 MED ORDER — HYDROMORPHONE HCL 2 MG PO TABS
4.0000 mg | ORAL_TABLET | ORAL | Status: DC | PRN
  Administered 2017-01-01 (×2): 4 mg via ORAL
  Filled 2016-12-31 (×2): qty 2

## 2016-12-31 MED ORDER — TRAMADOL HCL 50 MG PO TABS: 25.0000 mg | ORAL_TABLET | Freq: Three times a day (TID) | ORAL | Status: DC | PRN

## 2016-12-31 MED ORDER — CYCLOBENZAPRINE HCL 10 MG PO TABS
10.0000 mg | ORAL_TABLET | Freq: Three times a day (TID) | ORAL | Status: DC | PRN
  Administered 2016-12-31: 10 mg via ORAL

## 2016-12-31 MED ORDER — POLYETHYLENE GLYCOL 3350 17 G PO PACK
17.0000 g | PACK | Freq: Every day | ORAL | Status: DC
  Filled 2016-12-31: qty 1

## 2016-12-31 MED ORDER — 0.9 % SODIUM CHLORIDE (POUR BTL) OPTIME
TOPICAL | Status: DC | PRN
  Administered 2016-12-31: 1000 mL

## 2016-12-31 MED ORDER — THROMBIN 20000 UNITS EX SOLR
CUTANEOUS | Status: DC | PRN
  Administered 2016-12-31: 17:00:00 via TOPICAL

## 2016-12-31 MED ORDER — FENTANYL CITRATE (PF) 100 MCG/2ML IJ SOLN
INTRAMUSCULAR | Status: DC | PRN
  Administered 2016-12-31: 100 ug via INTRAVENOUS
  Administered 2016-12-31: 25 ug via INTRAVENOUS
  Administered 2016-12-31: 100 ug via INTRAVENOUS
  Administered 2016-12-31: 25 ug via INTRAVENOUS
  Administered 2016-12-31: 100 ug via INTRAVENOUS
  Administered 2016-12-31: 50 ug via INTRAVENOUS
  Administered 2016-12-31: 100 ug via INTRAVENOUS

## 2016-12-31 MED ORDER — LIDOCAINE 2% (20 MG/ML) 5 ML SYRINGE
INTRAMUSCULAR | Status: AC
  Filled 2016-12-31: qty 5

## 2016-12-31 MED ORDER — CHLORHEXIDINE GLUCONATE CLOTH 2 % EX PADS: 6.0000 | MEDICATED_PAD | Freq: Once | CUTANEOUS | Status: DC

## 2016-12-31 MED ORDER — LACTATED RINGERS IV SOLN
INTRAVENOUS | Status: DC
  Administered 2016-12-31 (×2): via INTRAVENOUS

## 2016-12-31 MED ORDER — CEFAZOLIN SODIUM-DEXTROSE 2-4 GM/100ML-% IV SOLN
2.0000 g | INTRAVENOUS | Status: AC
  Administered 2016-12-31: 2 g via INTRAVENOUS
  Filled 2016-12-31: qty 100

## 2016-12-31 MED ORDER — SODIUM CHLORIDE 0.9% FLUSH: 3.0000 mL | INTRAVENOUS | Status: DC | PRN

## 2016-12-31 MED ORDER — HYDROMORPHONE HCL 1 MG/ML IJ SOLN
INTRAMUSCULAR | Status: AC
Start: 2016-12-31 — End: 2017-01-01
  Filled 2016-12-31: qty 0.5

## 2016-12-31 MED ORDER — ACETAMINOPHEN 325 MG PO TABS: 650.0000 mg | ORAL_TABLET | ORAL | Status: DC | PRN

## 2016-12-31 MED ORDER — AZELASTINE HCL 0.1 % NA SOLN
1.0000 | Freq: Two times a day (BID) | NASAL | Status: DC
  Administered 2016-12-31 – 2017-01-01 (×2): 1 via NASAL
  Filled 2016-12-31: qty 30

## 2016-12-31 MED ORDER — CEFAZOLIN SODIUM-DEXTROSE 2-4 GM/100ML-% IV SOLN
2.0000 g | Freq: Three times a day (TID) | INTRAVENOUS | Status: AC
  Administered 2016-12-31 – 2017-01-01 (×2): 2 g via INTRAVENOUS
  Filled 2016-12-31 (×2): qty 100

## 2016-12-31 SURGICAL SUPPLY — 65 items
BAG DECANTER FOR FLEXI CONT (MISCELLANEOUS) ×3 IMPLANT
BENZOIN TINCTURE PRP APPL 2/3 (GAUZE/BANDAGES/DRESSINGS) ×3 IMPLANT
BLADE CLIPPER SURG (BLADE) IMPLANT
BUR MATCHSTICK NEURO 3.0 LAGG (BURR) ×3 IMPLANT
BUR PRECISION FLUTE 6.0 (BURR) ×3 IMPLANT
CAGE ALTERA 8X12-8 (Cage) ×3 IMPLANT
CANISTER SUCT 3000ML PPV (MISCELLANEOUS) ×3 IMPLANT
CAP REVERE LOCKING (Cap) ×12 IMPLANT
CARTRIDGE OIL MAESTRO DRILL (MISCELLANEOUS) ×1 IMPLANT
CLOSURE WOUND 1/2 X4 (GAUZE/BANDAGES/DRESSINGS) ×1
CONT SPEC 4OZ CLIKSEAL STRL BL (MISCELLANEOUS) ×3 IMPLANT
COVER BACK TABLE 60X90IN (DRAPES) ×6 IMPLANT
DIFFUSER DRILL AIR PNEUMATIC (MISCELLANEOUS) ×3 IMPLANT
DRAPE C-ARM 42X72 X-RAY (DRAPES) ×9 IMPLANT
DRAPE HALF SHEET 40X57 (DRAPES) ×6 IMPLANT
DRAPE LAPAROTOMY 100X72X124 (DRAPES) ×3 IMPLANT
DRAPE POUCH INSTRU U-SHP 10X18 (DRAPES) ×3 IMPLANT
DRAPE SURG 17X23 STRL (DRAPES) ×12 IMPLANT
ELECT BLADE 4.0 EZ CLEAN MEGAD (MISCELLANEOUS) ×3
ELECT REM PT RETURN 9FT ADLT (ELECTROSURGICAL) ×3
ELECTRODE BLDE 4.0 EZ CLN MEGD (MISCELLANEOUS) ×1 IMPLANT
ELECTRODE REM PT RTRN 9FT ADLT (ELECTROSURGICAL) ×1 IMPLANT
EVACUATOR 1/8 PVC DRAIN (DRAIN) IMPLANT
GAUZE SPONGE 4X4 12PLY STRL (GAUZE/BANDAGES/DRESSINGS) ×3 IMPLANT
GAUZE SPONGE 4X4 16PLY XRAY LF (GAUZE/BANDAGES/DRESSINGS) IMPLANT
GLOVE BIO SURGEON STRL SZ8 (GLOVE) ×6 IMPLANT
GLOVE BIO SURGEON STRL SZ8.5 (GLOVE) ×6 IMPLANT
GLOVE BIOGEL PI IND STRL 7.5 (GLOVE) ×1 IMPLANT
GLOVE BIOGEL PI INDICATOR 7.5 (GLOVE) ×2
GLOVE EXAM NITRILE LRG STRL (GLOVE) IMPLANT
GLOVE EXAM NITRILE XL STR (GLOVE) IMPLANT
GLOVE EXAM NITRILE XS STR PU (GLOVE) IMPLANT
GLOVE SURG SS PI 7.0 STRL IVOR (GLOVE) ×12 IMPLANT
GLOVE SURG SS PI 7.5 STRL IVOR (GLOVE) ×6 IMPLANT
GOWN STRL REUS W/ TWL LRG LVL3 (GOWN DISPOSABLE) ×2 IMPLANT
GOWN STRL REUS W/ TWL XL LVL3 (GOWN DISPOSABLE) ×2 IMPLANT
GOWN STRL REUS W/TWL 2XL LVL3 (GOWN DISPOSABLE) IMPLANT
GOWN STRL REUS W/TWL LRG LVL3 (GOWN DISPOSABLE) ×4
GOWN STRL REUS W/TWL XL LVL3 (GOWN DISPOSABLE) ×4
HEMOSTAT POWDER KIT SURGIFOAM (HEMOSTASIS) ×3 IMPLANT
KIT BASIN OR (CUSTOM PROCEDURE TRAY) ×3 IMPLANT
KIT ROOM TURNOVER OR (KITS) ×3 IMPLANT
NEEDLE HYPO 21X1.5 SAFETY (NEEDLE) ×3 IMPLANT
NEEDLE HYPO 22GX1.5 SAFETY (NEEDLE) ×3 IMPLANT
NS IRRIG 1000ML POUR BTL (IV SOLUTION) ×3 IMPLANT
OIL CARTRIDGE MAESTRO DRILL (MISCELLANEOUS) ×3
PACK LAMINECTOMY NEURO (CUSTOM PROCEDURE TRAY) ×3 IMPLANT
PAD ARMBOARD 7.5X6 YLW CONV (MISCELLANEOUS) ×9 IMPLANT
PATTIES SURGICAL .5 X1 (DISPOSABLE) IMPLANT
PUTTY KINEX BIACTIVE P 10CC (Putty) ×3 IMPLANT
RASP 3.0MM (RASP) ×3 IMPLANT
ROD REVERE 6.35 CURVED 60MM (Rod) ×6 IMPLANT
SCREW REVERE 6.5X50MM (Screw) ×9 IMPLANT
SPONGE LAP 4X18 X RAY DECT (DISPOSABLE) IMPLANT
SPONGE NEURO XRAY DETECT 1X3 (DISPOSABLE) IMPLANT
SPONGE SURGIFOAM ABS GEL 100 (HEMOSTASIS) ×3 IMPLANT
STRIP CLOSURE SKIN 1/2X4 (GAUZE/BANDAGES/DRESSINGS) ×2 IMPLANT
SUT VIC AB 1 CT1 18XBRD ANBCTR (SUTURE) ×2 IMPLANT
SUT VIC AB 1 CT1 8-18 (SUTURE) ×4
SUT VIC AB 2-0 CP2 18 (SUTURE) ×6 IMPLANT
TAPE CLOTH SURG 4X10 WHT LF (GAUZE/BANDAGES/DRESSINGS) ×3 IMPLANT
TOWEL GREEN STERILE (TOWEL DISPOSABLE) ×2 IMPLANT
TOWEL GREEN STERILE FF (TOWEL DISPOSABLE) ×3 IMPLANT
TRAY FOLEY W/METER SILVER 16FR (SET/KITS/TRAYS/PACK) ×3 IMPLANT
WATER STERILE IRR 1000ML POUR (IV SOLUTION) ×3 IMPLANT

## 2016-12-31 NOTE — Op Note (Signed)
Brief history: The patient is a 76 year old white female on whom I previously performed an L4-5 decompression, instrumentation, and fusion. She initially did well but has developed recurrent back and leg pain consistent with neurogenic claudication. She has failed medical management and was worked up with a lumbar MRI. This demonstrated severe spinal stenosis at L3-4. I discussed the various treatment options with the patient including surgery. She has weighed the risks, benefits, and alternatives to surgery and decided to proceed with an L3-4 decompression, instrumentation, and fusion.  Preoperative diagnosis: L3-4 Degenerative disc disease, spinal stenosis compressing both the L3 and the L4 nerve roots; lumbago; lumbar radiculopathy; neurogenic claudication  Postoperative diagnosis: The same   Procedure: Bilateral L3-4 Laminotomy/foraminotomies to decompress the bilateral L3 and L4 nerve roots(the work required to do this was in addition to the work required to do the posterior lumbar interbody fusion because of the patient's spinal stenosis, facet arthropathy. Etc. requiring a wide decompression of the nerve roots.); L3-4 transforaminal lumbar interbody fusion with local morselized autograft bone and Kinnex graft extender; insertion of interbody prosthesis at L3-4 (globus peek expandable interbody prosthesis); posterior segmental instrumentation from L3 to L5 with globus titanium pedicle screws and rods; posterior lateral arthrodesis at L3-4 with local morselized autograft bone and Kinnex bone graft extender; exploration of lumbar fusion  Surgeon: Dr. Earle Gell  Asst.: Dr. Cyndy Freeze  Anesthesia: Gen. endotracheal  Estimated blood loss: 200 mL  Drains: None  Complications: None  Description of procedure: The patient was brought to the operating room by the anesthesia team. General endotracheal anesthesia was induced. The patient was turned to the prone position on the Wilson frame. The patient's  lumbosacral region was then prepared with Betadine scrub and Betadine solution. Sterile drapes were applied.  I then injected the area to be incised with Marcaine with epinephrine solution. I then used the scalpel to make a linear midline incision over the L3-4 and L4-5 interspace. I then used electrocautery to perform a bilateral subperiosteal dissection exposing the spinous process and lamina of L3, L4 and L5, and also exposing the old hardware. We then inserted the Verstrac retractor to provide exposure.  We explored the fusion by removing the Old Rods. In the process of doing this, the screwdriver head broke off in the locking screw head of the right L4 pedicle screw area. This necessitated cutting the rod on the right, and removal of the old right L4 pedicle screw with the cut old rod in place. We replaced the screw at L4 on the right with a 6.5 x 50 mm screw. We inspected the fusion at L4-5 and it appears solid.  I began the decompression by using the high speed drill to perform laminotomies at L3-4 bilaterally. We then used the Kerrison punches to widen the laminotomy and removed the ligamentum flavum at L3-4 bilaterally. We used the Kerrison punches to remove the medial facets at L3-4 bilaterally. We performed wide foraminotomies about the bilateral L3 and L4 nerve roots completing the decompression.  We now turned our attention to the posterior lumbar interbody fusion. I used a scalpel to incise the intervertebral disc at L3-4 bilaterally. I then performed a partial intervertebral discectomy at L3-4 bilaterally using the pituitary forceps. We prepared the vertebral endplates at P7-9 bilaterally for the fusion by removing the soft tissues with the curettes. We then used the trial spacers to pick the appropriate sized interbody prosthesis. We prefilled his prosthesis with a combination of local morselized autograft bone that we obtained during  the decompression as well as Kinnex bone graft extender.  We inserted the prefilled prosthesis into the interspace at L3-4, we then expanded the prosthesis. There was a good snug fit of the prosthesis in the interspace. We then filled and the remainder of the intervertebral disc space with local morselized autograft bone and Kinnex. This completed the posterior lumbar interbody arthrodesis.  We now turned attention to the instrumentation. Under fluoroscopic guidance we cannulated the bilateral L3 pedicles with the bone probe. We then removed the bone probe. We then tapped the pedicle with a 5.5 millimeter tap. We then removed the tap. We probed inside the tapped pedicle with a ball probe to rule out cortical breaches. We then inserted a 6.5 x 50 millimeter pedicle screw into the L3 pedicles bilaterally under fluoroscopic guidance. We then palpated along the medial aspect of the pedicles to rule out cortical breaches. There were none. The nerve roots were not injured. We then connected the unilateral pedicle screws from L3-L5 with a lordotic rod. We compressed the construct and secured the rod in place with the caps. We then tightened the caps appropriately. This completed the instrumentation from L3-L5 bilaterally.  We now turned our attention to the posterior lateral arthrodesis at L3-4 bilaterally. We used the high-speed drill to decorticate the remainder of the facets, pars, transverse process at L3-4 bilaterally. We then applied a combination of local morselized autograft bone and Kinnex bone graft extender over these decorticated posterior lateral structures. This completed the posterior lateral arthrodesis.  We then obtained hemostasis using bipolar electrocautery. We irrigated the wound out with bacitracin solution. We inspected the thecal sac and nerve roots and noted they were well decompressed. We then removed the retractor. We placed vancomycin powder in the wound. We reapproximated patient's thoracolumbar fascia with interrupted #1 Vicryl suture. We  reapproximated patient's subcutaneous tissue with interrupted 2-0 Vicryl suture. The reapproximated patient's skin with Steri-Strips and benzoin. The wound was then coated with bacitracin ointment. A sterile dressing was applied. The drapes were removed. The patient was subsequently returned to the supine position where they were extubated by the anesthesia team. He was then transported to the post anesthesia care unit in stable condition. All sponge instrument and needle counts were reportedly correct at the end of this case.

## 2016-12-31 NOTE — Anesthesia Postprocedure Evaluation (Signed)
Anesthesia Post Note  Patient: Patricia Reid  Procedure(s) Performed: Procedure(s) (LRB): POSTERIOR LUMBAR INTERBODY FUSION, INTERBODY PROSTHESIS,POSTERIOR LATERAL ARTHRODESIS,POSTERIOR SEGMENTAL INSTRUMENTATION, LUMBAR THREE- LUMBAR FOUR, EXPLORE FUSION (N/A)  Patient location during evaluation: PACU Anesthesia Type: General Level of consciousness: awake and alert Pain management: pain level controlled Vital Signs Assessment: post-procedure vital signs reviewed and stable Respiratory status: spontaneous breathing, nonlabored ventilation, respiratory function stable and patient connected to nasal cannula oxygen Cardiovascular status: blood pressure returned to baseline and stable Postop Assessment: no signs of nausea or vomiting Anesthetic complications: no       Last Vitals:  Vitals:   12/31/16 1900 12/31/16 1915  BP: (!) 163/89 (!) 164/78  Pulse: (!) 104 96  Resp: 20 20  Temp:      Last Pain:  Vitals:   12/31/16 1915  TempSrc:   PainSc: 8                  Con Arganbright DAVID

## 2016-12-31 NOTE — H&P (Signed)
Subjective: The patient is a 76 year old white female on whom I performed an L4-5 decompression and fusion years ago. She has done well but has developed recurrent back and leg pain consistent with neurogenic claudication. She failed medical management and was worked up with a lumbar MRI. This demonstrated severe spinal stenosis at L3-4. I discussed the various treatment with the patient. She has decided to proceed with surgery.   Past Medical History:  Diagnosis Date  . Arthritis   . COPD (chronic obstructive pulmonary disease) (HCC)    slim to none  . GERD (gastroesophageal reflux disease)   . Hypertension     Past Surgical History:  Procedure Laterality Date  . BACK SURGERY  2008, 2016  . CHOLECYSTECTOMY    . COLONOSCOPY    . COLONOSCOPY WITH PROPOFOL N/A 01/24/2015   Procedure: COLONOSCOPY WITH PROPOFOL;  Surgeon: Manya Silvas, MD;  Location: [facility] ENDOSCOPY;  Service: Endoscopy;  Laterality: N/A;  . ESOPHAGOGASTRODUODENOSCOPY N/A 01/24/2015   Procedure: ESOPHAGOGASTRODUODENOSCOPY (EGD);  Surgeon: Manya Silvas, MD;  Location: [facility] ENDOSCOPY;  Service: Endoscopy;  Laterality: N/A;  . SAVORY DILATION N/A 01/24/2015   Procedure: SAVORY DILATION;  Surgeon: Manya Silvas, MD;  Location: [facility] ENDOSCOPY;  Service: Endoscopy;  Laterality: N/A;  . TONSILLECTOMY      Allergies  Allergen Reactions  . Simvastatin Other (See Comments)    Myalgias at 80 mg dose  . Codeine Other (See Comments)    Makes her feel strange    Social History  Substance Use Topics  . Smoking status: Current Every Day Smoker    Packs/day: 1.00    Years: 50.00    Types: Cigarettes  . Smokeless tobacco: Never Used  . Alcohol use Yes     Comment: occasionally    Family History  Problem Relation Age of Onset  . Breast cancer Neg Hx    Prior to Admission medications   Medication Sig Start Date End Date Taking? Authorizing Provider  azelastine (ASTELIN) 0.1 % nasal spray Place 1 spray into both  nostrils 2 (two) times daily. 07/18/14  Yes [provider]  celecoxib (CELEBREX) 200 MG capsule Take 200 mg by mouth 2 (two) times daily.   Yes [provider]  cholecalciferol (VITAMIN D) 1000 UNITS tablet Take 1,000 Units by mouth daily.   Yes [provider]  esomeprazole (NEXIUM) 40 MG capsule Take 40 mg by mouth 2 (two) times daily before a meal.   Yes [provider]  fluticasone (FLONASE) 50 MCG/ACT nasal spray Place 2 sprays into both nostrils 2 (two) times daily.    Yes [provider]  gabapentin (NEURONTIN) 300 MG capsule Take 300 mg by mouth 3 (three) times daily.  06/07/14  Yes [provider]  hydrochlorothiazide (HYDRODIURIL) 25 MG tablet Take 12.5 mg by mouth daily.  05/31/14  Yes [provider]  HYDROmorphone (DILAUDID) 4 MG tablet Take 4 mg by mouth every 4 (four) hours as needed for severe pain.   Yes [provider]  loratadine (CLARITIN) 10 MG tablet Take 10 mg by mouth daily as needed for allergies.    Yes [provider]  Multiple Vitamin (MULTIVITAMIN WITH MINERALS) TABS tablet Take 1 tablet by mouth daily.   Yes [provider]  polyethylene glycol powder (GLYCOLAX/MIRALAX) powder Take 17 g by mouth daily.   Yes [provider]  traMADol (ULTRAM) 50 MG tablet Take 25-50 mg by mouth 3 (three) times daily as needed for severe pain.  Yes [provider]  temazepam (RESTORIL) 15 MG capsule Take 15 mg by mouth at bedtime as needed for sleep.     [provider]     Review of Systems  Positive ROS: As above  All other systems have been reviewed and were otherwise negative with the exception of those mentioned in the HPI and as above.  Objective: Vital signs in last 24 hours: Temp:  [98.2 F (36.8 C)] 98.2 F (36.8 C) (05/23 1210) Pulse Rate:  [108] 108 (05/23 1210) Resp:  [18] 18 (05/23 1210) BP: (201)/(91) 201/91 (05/23 1210) SpO2:  [99 %] 99 %  (05/23 1210) Weight:  [65.8 kg (145 lb)] 65.8 kg (145 lb) (05/23 1210)  General Appearance: Alert Head: Normocephalic, without obvious abnormality, atraumatic Eyes: PERRL, conjunctiva/corneas clear, EOM's intact,    Ears: Normal  Throat: Normal  Neck: Supple, Back: unremarkable, the patient's lumbar incision is well-healed. Lungs: Clear to auscultation bilaterally, respirations unlabored Heart: Regular rate and rhythm, no murmur, rub or gallop Abdomen: Soft, non-tender Extremities: Extremities normal, atraumatic, no cyanosis or edema Skin: unremarkable  NEUROLOGIC:   Mental status: alert and oriented,Motor Exam - grossly normal Sensory Exam - grossly normal Reflexes:  Coordination - grossly normal Gait - grossly normal Balance - grossly normal Cranial Nerves: I: smell Not tested  II: visual acuity  OS: Normal  OD: Normal   II: visual fields Full to confrontation  II: pupils Equal, round, reactive to light  III,VII: ptosis None  III,IV,VI: extraocular muscles  Full ROM  V: mastication Normal  V: facial light touch sensation  Normal  V,VII: corneal reflex  Present  VII: facial muscle function - upper  Normal  VII: facial muscle function - lower Normal  VIII: hearing Not tested  IX: soft palate elevation  Normal  IX,X: gag reflex Present  XI: trapezius strength  5/5  XI: sternocleidomastoid strength 5/5  XI: neck flexion strength  5/5  XII: tongue strength  Normal    Data Review Lab Results  Component Value Date   WBC 7.1 12/23/2016   HGB 11.0 (L) 12/23/2016   HCT 35.1 (L) 12/23/2016   MCV 78.7 12/23/2016   PLT 260 12/23/2016   Lab Results  Component Value Date   NA 133 (L) 12/23/2016   K 3.9 12/23/2016   CL 97 (L) 12/23/2016   CO2 28 12/23/2016   BUN 13 12/23/2016   CREATININE 0.90 12/23/2016   GLUCOSE 100 (H) 12/23/2016   No results found for: INR, PROTIME  Assessment/Plan: L3-4 spinal stenosis, lumbago, lumbar radiculopathy, neurogenic claudication:  I have discussed the situation with the patient. I have reviewed her imaging studies with her and pointed out the abnormalities. We have discussed the various treatment options including surgery. I have described the surgical treatment option of an exploration of her lumbar fusion with an L3-4 instrumentation decompression and fusion. I described the surgery. I've shown her surgical models. We have discussed the risks, benefits, alternatives, expected postoperative course, and likelihood of achieving our goals with surgery. I have answered all her questions. She has decided to proceed with surgery.   Ophelia Charter 12/31/2016 2:37 PM

## 2016-12-31 NOTE — Anesthesia Preprocedure Evaluation (Addendum)
Anesthesia Evaluation  Patient identified by MRN, date of birth, ID band Patient awake    Reviewed: Allergy & Precautions, H&P , NPO status , Patient's Chart, lab work & pertinent test results  Airway Mallampati: II  TM Distance: >3 FB Neck ROM: Full    Dental no notable dental hx. (+) Teeth Intact, Dental Advisory Given   Pulmonary neg pulmonary ROS, COPD, Current Smoker,    Pulmonary exam normal breath sounds clear to auscultation       Cardiovascular hypertension, Pt. on medications  Rhythm:Regular Rate:Normal     Neuro/Psych negative neurological ROS  negative psych ROS   GI/Hepatic Neg liver ROS, GERD  Medicated and Controlled,  Endo/Other  negative endocrine ROS  Renal/GU negative Renal ROS  negative genitourinary   Musculoskeletal  (+) Arthritis ,   Abdominal   Peds  Hematology negative hematology ROS (+)   Anesthesia Other Findings   Reproductive/Obstetrics negative OB ROS                            Lab Results  Component Value Date   WBC 7.1 12/23/2016   HGB 11.0 (L) 12/23/2016   HCT 35.1 (L) 12/23/2016   MCV 78.7 12/23/2016   PLT 260 12/23/2016   Lab Results  Component Value Date   CREATININE 0.90 12/23/2016   BUN 13 12/23/2016   NA 133 (L) 12/23/2016   K 3.9 12/23/2016   CL 97 (L) 12/23/2016   CO2 28 12/23/2016    Anesthesia Physical Anesthesia Plan  ASA: II  Anesthesia Plan: General   Post-op Pain Management:    Induction: Intravenous  Airway Management Planned: Oral ETT  Additional Equipment:   Intra-op Plan:   Post-operative Plan: Extubation in OR  Informed Consent: I have reviewed the patients History and Physical, chart, labs and discussed the procedure including the risks, benefits and alternatives for the proposed anesthesia with the patient or authorized representative who has indicated his/her understanding and acceptance.     Plan  Discussed with: CRNA and Surgeon  Anesthesia Plan Comments:        Anesthesia Quick Evaluation

## 2016-12-31 NOTE — Anesthesia Procedure Notes (Signed)
Procedure Name: Intubation Date/Time: 12/31/2016 3:21 PM Performed by: Sampson Si E Pre-anesthesia Checklist: Patient identified, Emergency Drugs available, Suction available and Patient being monitored Patient Re-evaluated:Patient Re-evaluated prior to inductionOxygen Delivery Method: Circle System Utilized Preoxygenation: Pre-oxygenation with 100% oxygen Intubation Type: IV induction Ventilation: Mask ventilation without difficulty Laryngoscope Size: Mac and 3 Grade View: Grade II Tube type: Oral Tube size: 7.0 mm Number of attempts: 1 Airway Equipment and Method: Stylet and Oral airway Placement Confirmation: ETT inserted through vocal cords under direct vision,  positive ETCO2 and breath sounds checked- equal and bilateral Secured at: 21 cm Tube secured with: Tape Dental Injury: Teeth and Oropharynx as per pre-operative assessment

## 2016-12-31 NOTE — Transfer of Care (Signed)
Immediate Anesthesia Transfer of Care Note  Patient: Patricia Reid  Procedure(s) Performed: Procedure(s): POSTERIOR LUMBAR INTERBODY FUSION, INTERBODY PROSTHESIS,POSTERIOR LATERAL ARTHRODESIS,POSTERIOR SEGMENTAL INSTRUMENTATION, LUMBAR THREE- LUMBAR FOUR, EXPLORE FUSION (N/A)  Patient Location: PACU  Anesthesia Type:General  Level of Consciousness: awake, alert  and oriented  Airway & Oxygen Therapy: Patient Spontanous Breathing and Patient connected to nasal cannula oxygen  Post-op Assessment: Report given to RN and Patient moving all extremities X 4  Post vital signs: Reviewed and stable  Last Vitals:  Vitals:   12/31/16 1210 12/31/16 1845  BP: (!) 201/91   Pulse: (!) 108   Resp: 18   Temp: 36.8 C 36.5 C    Last Pain:  Vitals:   12/31/16 1226  TempSrc:   PainSc: 2          Complications: No apparent anesthesia complications

## 2016-12-31 NOTE — Progress Notes (Signed)
Received from PACU, patient assisted in stretcher / bed transfer.  Foley intact, gauze dressing dry & intact lower back.  Oriented to room, safety precautions & orders.  Demo log roll, SCDs on.

## 2017-01-01 LAB — CBC
HCT: 31.7 % — ABNORMAL LOW (ref 36.0–46.0)
HEMOGLOBIN: 9.6 g/dL — AB (ref 12.0–15.0)
MCH: 24.1 pg — AB (ref 26.0–34.0)
MCHC: 30.3 g/dL (ref 30.0–36.0)
MCV: 79.4 fL (ref 78.0–100.0)
PLATELETS: 218 10*3/uL (ref 150–400)
RBC: 3.99 MIL/uL (ref 3.87–5.11)
RDW: 17.1 % — ABNORMAL HIGH (ref 11.5–15.5)
WBC: 8.4 10*3/uL (ref 4.0–10.5)

## 2017-01-01 LAB — BASIC METABOLIC PANEL
ANION GAP: 8 (ref 5–15)
BUN: 6 mg/dL (ref 6–20)
CHLORIDE: 99 mmol/L — AB (ref 101–111)
CO2: 28 mmol/L (ref 22–32)
Calcium: 8.5 mg/dL — ABNORMAL LOW (ref 8.9–10.3)
Creatinine, Ser: 0.68 mg/dL (ref 0.44–1.00)
GFR calc Af Amer: >60 mL/min (ref >60)
GFR calc non Af Amer: >60 mL/min (ref >60)
GLUCOSE: 99 mg/dL (ref 65–99)
POTASSIUM: 3.3 mmol/L — AB (ref 3.5–5.1)
Sodium: 135 mmol/L (ref 135–145)

## 2017-01-01 MED ORDER — CYCLOBENZAPRINE HCL 10 MG PO TABS: 10.0000 mg | ORAL_TABLET | Freq: Three times a day (TID) | ORAL | 1 refills | Status: AC | PRN

## 2017-01-01 MED ORDER — HYDROMORPHONE HCL 4 MG PO TABS: 4.0000 mg | ORAL_TABLET | ORAL | 0 refills | Status: AC | PRN

## 2017-01-01 MED ORDER — MANAGING BACK PAIN BOOK
Freq: Once | Status: AC
  Administered 2017-01-01: 06:00:00
  Filled 2017-01-01: qty 1

## 2017-01-01 MED FILL — Sodium Chloride IV Soln 0.9%: INTRAVENOUS | Qty: 1000 | Status: AC

## 2017-01-01 MED FILL — Heparin Sodium (Porcine) Inj 1000 Unit/ML: INTRAMUSCULAR | Qty: 30 | Status: AC

## 2017-01-01 NOTE — Progress Notes (Signed)
Pt being discharged home. Discharge instructions were reviewed with the pt. Pt verbalized understanding.

## 2017-01-01 NOTE — Care Management Note (Signed)
Case Management Note  Patient Details  Name: [name] MRN: [id number] Date of Birth: [birth year]  Subjective/Objective:  Pt underwent: POSTERIOR LUMBAR INTERBODY FUSION, INTERBODY PROSTHESIS,POSTERIOR LATERAL ARTHRODESIS,POSTERIOR SEGMENTAL INSTRUMENTATION, LUMBAR THREE- LUMBAR FOUR, EXPLORE FUSION. She is from home alone.                    Action/Plan: No f/u per PT and no DME needs. Pt has insurance and hospital follow up. No further needs per CM.   Expected Discharge Date:  01/01/17               Expected Discharge Plan:  Home/Self Care  In-House Referral:     Discharge planning Services     Post Acute Care Choice:    Choice offered to:     DME Arranged:    DME Agency:     HH Arranged:    HH Agency:     Status of Service:  Completed, signed off  If discussed at [employer] of Stay Meetings, dates discussed:    Additional Comments:  Pollie Friar, RN 01/01/2017, 2:42 PM

## 2017-01-01 NOTE — Discharge Summary (Signed)
Physician Discharge Summary  Patient ID: Patricia Reid MRN: [id number] DOB/AGE: Dec 31, 1940 76 y.o.  Admit date: 12/31/2016 Discharge date: 01/01/2017  Admission Diagnoses:L3-4 spinal stenosis, lumbago, lumbar radiculopathy, neurogenic claudication  Discharge Diagnoses: The same Active Problems:   Lumbar stenosis with neurogenic claudication   Discharged Condition: good  Hospital Course: I performed an L3-4 decompression, instrumentation, and fusion on the patient on 12/31/2016. The surgery went well.  The patient's postoperative course was unremarkable. On postoperative day #1 she requested discharge to home. She was given written and oral discharge instructions. All her questions were answered.  Consults: Physical therapy Significant Diagnostic Studies: None Treatments: L3-4 decompression, agitation, fusion, exploration of fusion Discharge Exam: Blood pressure (!) 145/68, pulse (!) 103, temperature 99 F (37.2 C), temperature source Oral, resp. rate 16, height 5\' 5"  (1.651 m), weight 71.5 kg (157 lb 11.2 oz), SpO2 96 %. The patient is alert and pleasant. She looks well. Her strength is normal in her lower extremities.  Disposition: Home  Discharge Instructions    Call MD for:  difficulty breathing, headache or visual disturbances    Complete by:  As directed    Call MD for:  extreme fatigue    Complete by:  As directed    Call MD for:  hives    Complete by:  As directed    Call MD for:  persistant dizziness or light-headedness    Complete by:  As directed    Call MD for:  persistant nausea and vomiting    Complete by:  As directed    Call MD for:  redness, tenderness, or signs of infection (pain, swelling, redness, odor or green/yellow discharge around incision site)    Complete by:  As directed    Call MD for:  severe uncontrolled pain    Complete by:  As directed    Call MD for:  temperature >100.4    Complete by:  As directed    Diet - low sodium heart healthy     Complete by:  As directed    Discharge instructions    Complete by:  As directed    Call [phone] for a followup appointment. Take a stool softener while you are using pain medications.   Driving Restrictions    Complete by:  As directed    Do not drive for 2 weeks.   Increase activity slowly    Complete by:  As directed    Lifting restrictions    Complete by:  As directed    Do not lift more than 5 pounds. No excessive bending or twisting.   May shower / Bathe    Complete by:  As directed    He may shower after the pain she is removed 3 days after surgery. Leave the incision alone.   Remove dressing in 48 hours    Complete by:  As directed    Your stitches are under the scan and will dissolve by themselves. The Steri-Strips will fall off after you take a few showers. Do not rub back or pick at the wound, Leave the wound alone.     Allergies as of 01/01/2017      Reactions   Simvastatin Other (See Comments)   Myalgias at 80 mg dose   Codeine Other (See Comments)   Makes her feel strange      Medication List    STOP taking these medications   celecoxib 200 MG capsule Commonly known as:  CELEBREX     TAKE  these medications   azelastine 0.1 % nasal spray Commonly known as:  ASTELIN Place 1 spray into both nostrils 2 (two) times daily.   cholecalciferol 1000 units tablet Commonly known as:  VITAMIN D Take 1,000 Units by mouth daily.   cyclobenzaprine 10 MG tablet Commonly known as:  FLEXERIL Take 1 tablet (10 mg total) by mouth 3 (three) times daily as needed for muscle spasms.   esomeprazole 40 MG capsule Commonly known as:  NEXIUM Take 40 mg by mouth 2 (two) times daily before a meal.   fluticasone 50 MCG/ACT nasal spray Commonly known as:  FLONASE Place 2 sprays into both nostrils 2 (two) times daily.   gabapentin 300 MG capsule Commonly known as:  NEURONTIN Take 300 mg by mouth 3 (three) times daily.   hydrochlorothiazide 25 MG tablet Commonly known  as:  HYDRODIURIL Take 12.5 mg by mouth daily.   HYDROmorphone 4 MG tablet Commonly known as:  DILAUDID Take 4 mg by mouth every 4 (four) hours as needed for severe pain. What changed:  Another medication with the same name was added. Make sure you understand how and when to take each.   HYDROmorphone 4 MG tablet Commonly known as:  DILAUDID Take 1 tablet (4 mg total) by mouth every 4 (four) hours as needed for severe pain. What changed:  You were already taking a medication with the same name, and this prescription was added. Make sure you understand how and when to take each.   loratadine 10 MG tablet Commonly known as:  CLARITIN Take 10 mg by mouth daily as needed for allergies.   multivitamin with minerals Tabs tablet Take 1 tablet by mouth daily.   polyethylene glycol powder powder Commonly known as:  GLYCOLAX/MIRALAX Take 17 g by mouth daily.   temazepam 15 MG capsule Commonly known as:  RESTORIL Take 15 mg by mouth at bedtime as needed for sleep.   traMADol 50 MG tablet Commonly known as:  ULTRAM Take 25-50 mg by mouth 3 (three) times daily as needed for severe pain.        SignedOphelia Charter 01/01/2017, 2:35 PM

## 2017-01-01 NOTE — Evaluation (Signed)
Physical Therapy Evaluation Patient Details Name: Patricia Reid MRN: [id number] DOB: September 20, 1940 Today's Date: 01/01/2017   History of Present Illness  pt is a 76 y/o female with pmh significant for COPD, HTN, L45 fusion, admitted with leg pain consistent for neurogenic claudication, s/p L34 decompression and TLIF with bone grafting.  Clinical Impression  Pt admitted with/for elective Lumbar fusion surgery.  Pt is at min guard level of mobility at this time due to pain and guarding.  Pt currently limited functionally due to the problems listed below.  (see problems list.)  Pt will benefit from PT to maximize function and safety to be able to get home safely with available assist .     Follow Up Recommendations No PT follow up    Equipment Recommendations  None recommended by PT    Recommendations for Other Services       Precautions / Restrictions Precautions Precautions: Back Required Braces or Orthoses:  (pt chosing to not use brace due to pain)      Mobility  Bed Mobility               General bed mobility comments: demonstrated in/out of bed.  Transfers Overall transfer level: Needs assistance   Transfers: Sit to/from Stand Sit to Stand: Min guard         General transfer comment: for safety only  Ambulation/Gait Ambulation/Gait assistance: Min guard Ambulation Distance (Feet): 150 Feet Assistive device: None (rail) Gait Pattern/deviations: Step-through pattern Gait velocity: slower Gait velocity interpretation: Below normal speed for age/gender General Gait Details: mildly unsteady with LOB.  pt wanting to touch wall or rail  Stairs Stairs: Yes Stairs assistance: Min guard Stair Management: One rail Right;Step to pattern;Forwards;Sideways Number of Stairs: 2 General stair comments: safe with holding the rail  Wheelchair Mobility    Modified Rankin (Stroke Patients Only)       Balance Overall balance assessment: Needs  assistance Sitting-balance support: No upper extremity supported Sitting balance-Leahy Scale: Good       Standing balance-Leahy Scale: Fair                               Pertinent Vitals/Pain Pain Assessment: Faces Faces Pain Scale: Hurts even more Pain Location: back Pain Descriptors / Indicators: Discomfort;Grimacing;Guarding Pain Intervention(s): Monitored during session;Premedicated before session    Home Living Family/patient expects to be discharged to:: Private residence Living Arrangements: Alone Available Help at Discharge: Friend(s);Available PRN/intermittently;Family Type of Home: House Home Access: Stairs to enter   Technical brewer of Steps: 1 Home Layout: One level Home Equipment: Cane - single point      Prior Function Level of Independence: Independent               Hand Dominance        Extremity/Trunk Assessment   Upper Extremity Assessment Upper Extremity Assessment: Overall WFL for tasks assessed    Lower Extremity Assessment Lower Extremity Assessment: Overall WFL for tasks assessed       Communication   Communication: No difficulties  Cognition Arousal/Alertness: Awake/alert Behavior During Therapy: WFL for tasks assessed/performed Overall Cognitive Status: Within Functional Limits for tasks assessed                                        General Comments General comments (skin integrity, edema, etc.): pt instructed in  back care/prec, putting on socks/shoes, bed mobility, lifting restrictions, progression of activity.    Exercises     Assessment/Plan    PT Assessment Patient needs continued PT services  PT Problem List Decreased activity tolerance;Decreased balance;Decreased mobility;Decreased knowledge of precautions;Pain       PT Treatment Interventions Gait training;Functional mobility training;Balance training;Patient/family education;Therapeutic activities    PT Goals (Current  goals can be found in the Care Plan section)  Acute Rehab PT Goals Patient Stated Goal: home and able to do for myself PT Goal Formulation: With patient Time For Goal Achievement: 01/08/17 Potential to Achieve Goals: Good    Frequency Min 5X/week   Barriers to discharge Decreased caregiver support intermittent assist    Co-evaluation               AM-PAC PT "6 Clicks" Daily Activity  Outcome Measure Difficulty turning over in bed (including adjusting bedclothes, sheets and blankets)?: A Little Difficulty moving from lying on back to sitting on the side of the bed? : A Little Difficulty sitting down on and standing up from a chair with arms (e.g., wheelchair, bedside commode, etc,.)?: A Little Help needed moving to and from a bed to chair (including a wheelchair)?: A Little Help needed walking in hospital room?: A Little Help needed climbing 3-5 steps with a railing? : A Little 6 Click Score: 18    End of Session Equipment Utilized During Treatment: Other (comment) ( pt chosing to leave back brace off) Activity Tolerance: Patient tolerated treatment well;Patient limited by pain Patient left: in chair;with call bell/phone within reach;with family/visitor present Nurse Communication: Mobility status PT Visit Diagnosis: Unsteadiness on feet (R26.81);Other abnormalities of gait and mobility (R26.89)    Time: 4010-2725 PT Time Calculation (min) (ACUTE ONLY): 27 min   Charges:   PT Evaluation $PT Eval Low Complexity: 1 Procedure PT Treatments $Gait Training: 8-22 mins   PT G Codes:        14-Jan-2017  Donnella Sham, PT [phone]  (pager)  Tessie Fass Mackie Goon 01/14/17, 1:30 PM

## 2017-04-06 ENCOUNTER — Other Ambulatory Visit: Payer: Self-pay | Admitting: Internal Medicine

## 2017-04-06 DIAGNOSIS — Z1231 Encounter for screening mammogram for malignant neoplasm of breast: Secondary | ICD-10-CM

## 2017-05-13 ENCOUNTER — Ambulatory Visit
Admission: RE | Admit: 2017-05-13 | Discharge: 2017-05-13 | Disposition: A | Discharge status: Home / Self Care | Payer: Medicare Other | Source: Ambulatory Visit | Attending: Internal Medicine | Admitting: Internal Medicine

## 2017-05-13 DIAGNOSIS — Z1231 Encounter for screening mammogram for malignant neoplasm of breast: Secondary | ICD-10-CM | POA: Diagnosis not present

## 2017-05-18 ENCOUNTER — Telehealth: Payer: Self-pay | Admitting: *Deleted

## 2017-05-18 NOTE — Telephone Encounter (Signed)
Left message for patient to notify them that it is time to schedule annual low dose lung cancer screening CT scan. Instructed patient to call back to verify information prior to the scan being scheduled.  

## 2017-05-19 ENCOUNTER — Telehealth: Payer: Self-pay | Admitting: *Deleted

## 2017-05-19 DIAGNOSIS — Z122 Encounter for screening for malignant neoplasm of respiratory organs: Secondary | ICD-10-CM

## 2017-05-19 DIAGNOSIS — Z87891 Personal history of nicotine dependence: Secondary | ICD-10-CM

## 2017-05-19 NOTE — Telephone Encounter (Signed)
Notified patient that annual lung cancer screening low dose CT scan is due currently or will be in near future. Confirmed that patient is within the age range of 55-77, and asymptomatic, (no signs or symptoms of lung cancer). Patient denies illness that would prevent curative treatment for lung cancer if found. Verified smoking history, (current, 51 pack year). The shared decision making visit was done 05/20/16. Patient is agreeable for CT scan being scheduled.

## 2017-05-28 ENCOUNTER — Ambulatory Visit: Payer: Medicare Other

## 2017-06-01 ENCOUNTER — Ambulatory Visit
Admission: RE | Admit: 2017-06-01 | Discharge: 2017-06-01 | Disposition: A | Discharge status: Home / Self Care | Payer: Medicare Other | Source: Ambulatory Visit | Attending: Oncology | Admitting: Oncology

## 2017-06-01 DIAGNOSIS — J432 Centrilobular emphysema: Secondary | ICD-10-CM | POA: Diagnosis not present

## 2017-06-01 DIAGNOSIS — Z122 Encounter for screening for malignant neoplasm of respiratory organs: Secondary | ICD-10-CM | POA: Diagnosis present

## 2017-06-01 DIAGNOSIS — Z87891 Personal history of nicotine dependence: Secondary | ICD-10-CM | POA: Insufficient documentation

## 2017-06-01 DIAGNOSIS — I700 Atherosclerosis of aorta: Secondary | ICD-10-CM | POA: Insufficient documentation

## 2017-06-01 DIAGNOSIS — I2510 Atherosclerotic heart disease of native coronary artery without angina pectoris: Secondary | ICD-10-CM | POA: Insufficient documentation

## 2017-06-08 ENCOUNTER — Encounter: Payer: Self-pay | Admitting: *Deleted

## 2017-06-09 DIAGNOSIS — I2510 Atherosclerotic heart disease of native coronary artery without angina pectoris: Secondary | ICD-10-CM | POA: Insufficient documentation

## 2017-06-09 DIAGNOSIS — I700 Atherosclerosis of aorta: Secondary | ICD-10-CM | POA: Insufficient documentation

## 2018-03-28 IMAGING — RF DG C-ARM 61-120 MIN
1 series · 4 of 4 positions shown · non-contrast
Comparison: MRI of the lumbar spine performed 10/06/2016

CLINICAL DATA: Lumbar spinal fusion at L3-L4.

EXAM:
LUMBAR SPINE - 2-3 VIEW

[Series 1: run · 4 of 4 slices shown]
[im 1/4]
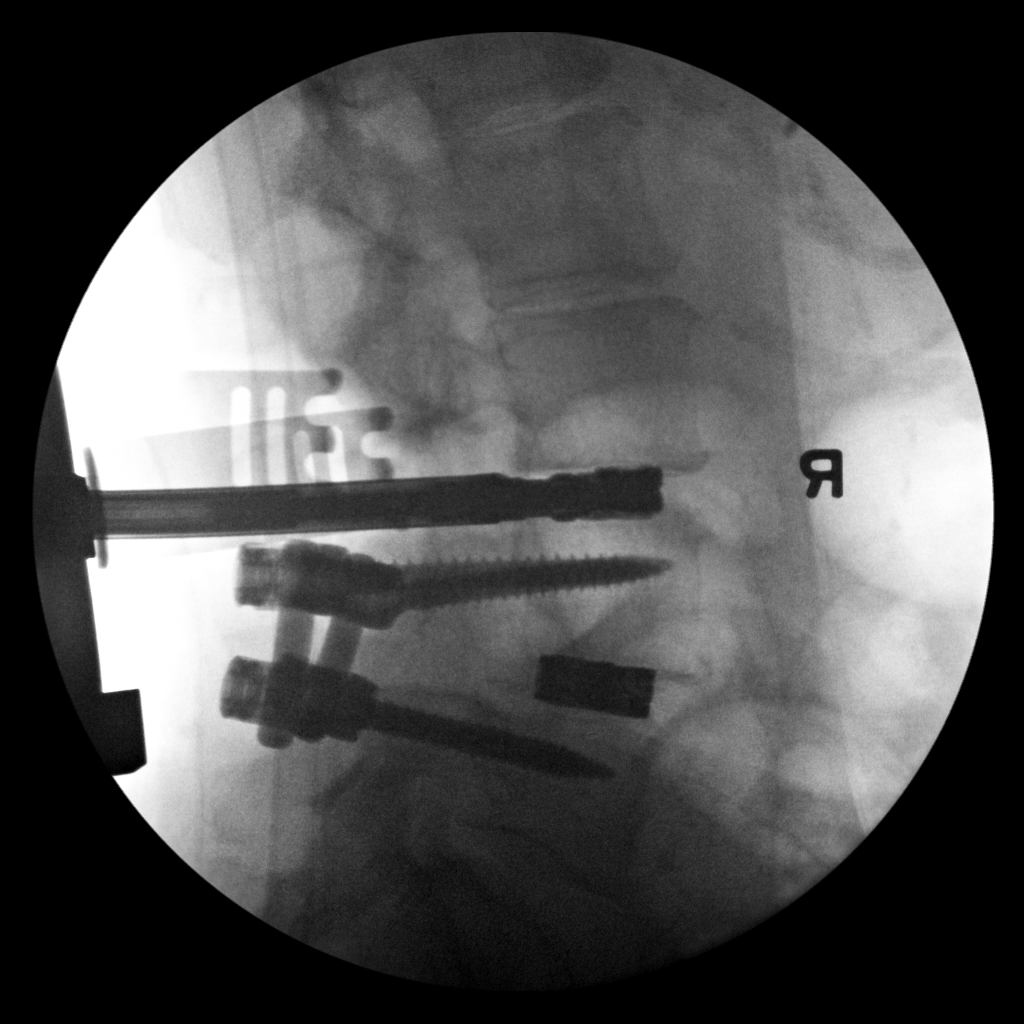
[im 2/4]
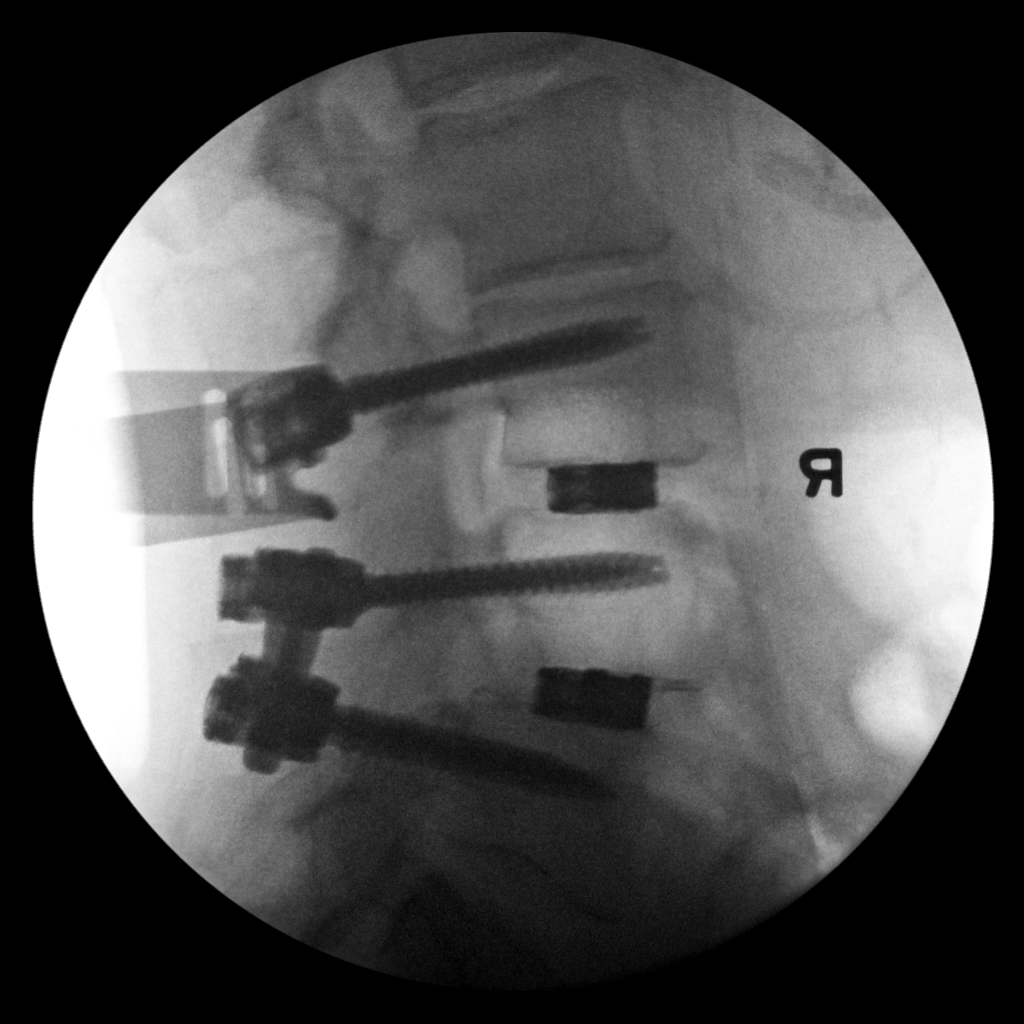
[im 3/4]
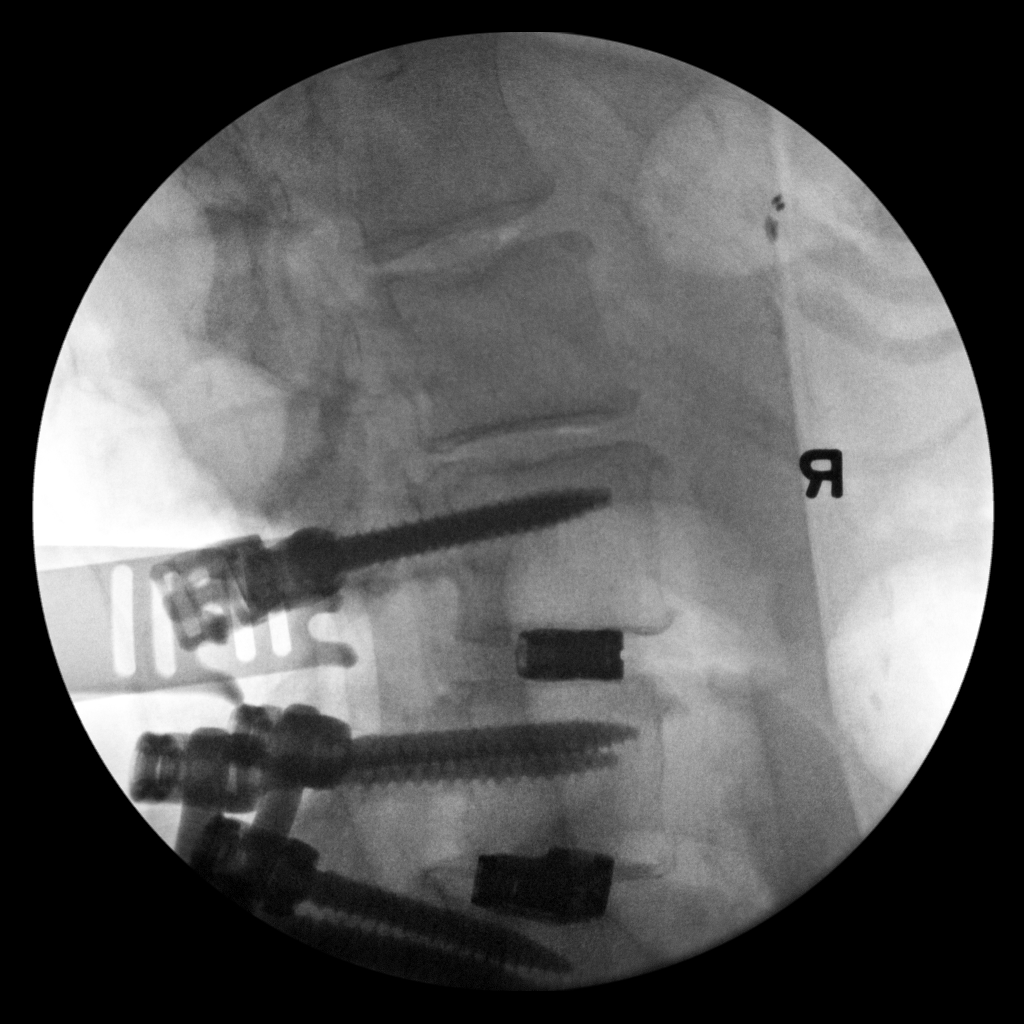
[im 4/4]
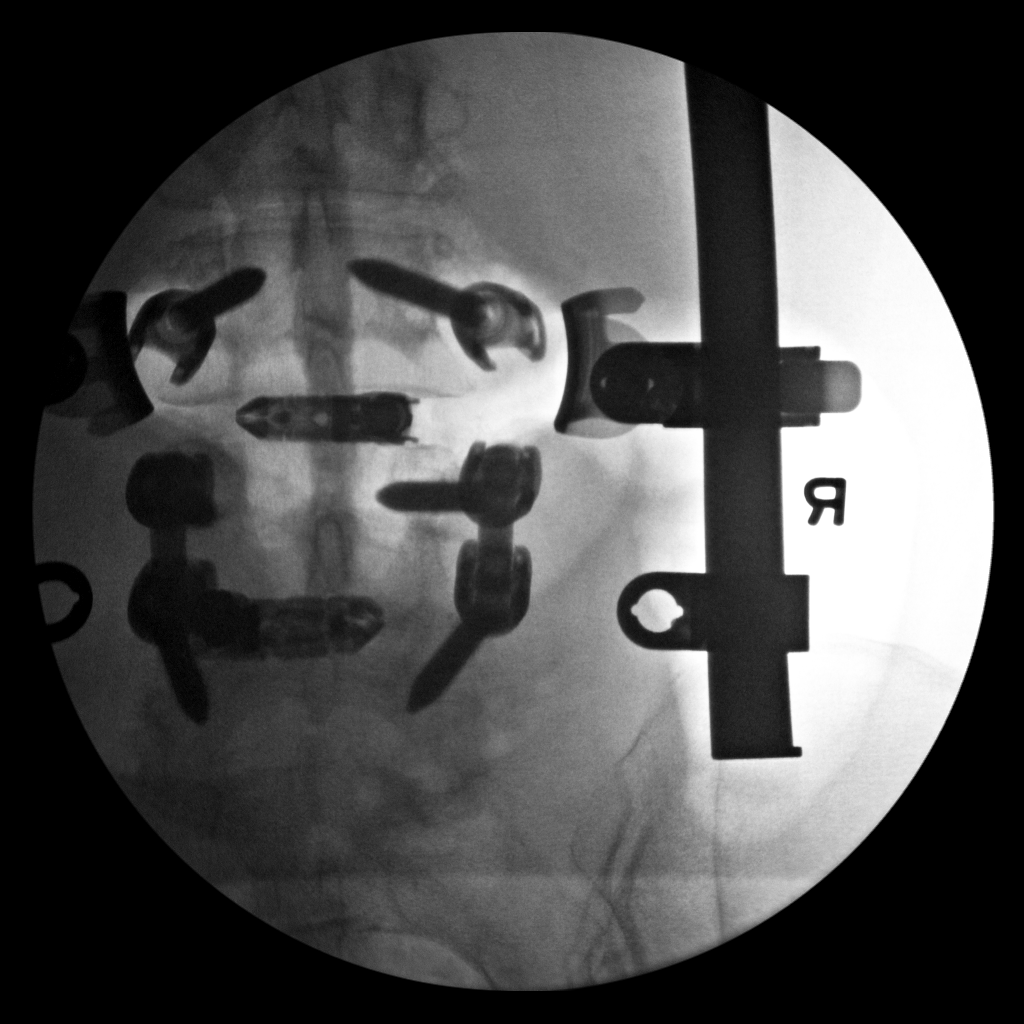

[4 of 4 positions shown; findings below may reference images not displayed]

FINDINGS: Four fluoroscopic C-arm images are provided from the OR,
demonstrating placement of new pedicle screws at L3, with associated
spacer at L3-L4. Underlying fusion hardware at L4-L5 is again noted.
IMPRESSION: Status post lumbar spinal fusion at L3-L4; underlying hardware again
noted at L4-L5.

## 2018-04-19 ENCOUNTER — Other Ambulatory Visit: Payer: Self-pay | Admitting: Internal Medicine

## 2018-04-19 DIAGNOSIS — Z1231 Encounter for screening mammogram for malignant neoplasm of breast: Secondary | ICD-10-CM

## 2018-05-13 ENCOUNTER — Telehealth: Payer: Self-pay | Admitting: *Deleted

## 2018-05-13 NOTE — Telephone Encounter (Signed)
Attempted to contact patient r/t LDCT Screening follow up due at this time.  No answer received, message left for patient to call [phone] to schedule appointment.    

## 2018-05-17 ENCOUNTER — Encounter: Payer: Self-pay | Admitting: *Deleted

## 2018-05-17 ENCOUNTER — Telehealth: Payer: Self-pay | Admitting: *Deleted

## 2018-05-17 DIAGNOSIS — Z122 Encounter for screening for malignant neoplasm of respiratory organs: Secondary | ICD-10-CM

## 2018-05-17 NOTE — Telephone Encounter (Signed)
Patient has been notified that the annual lung cancer screening low dose CT scan is due currently or will be in the near future.  Confirmed that the patient is within the age range of 79-80, and asymptomatic, and currently exhibits no signs or symptoms of lung cancer.  Patient denies illness that would prevent curative treatment for lung cancer if found.  Verified smoking history, current smoker with a 52pkyr history .  The shared decision making visit was completed on 05-20-16.  Patient is agreeable for the CT scan to be scheduled.  Will call patient back with date and time of appointment.

## 2018-05-26 ENCOUNTER — Ambulatory Visit
Admission: RE | Admit: 2018-05-26 | Discharge: 2018-05-26 | Disposition: A | Discharge status: Home / Self Care | Payer: Medicare Other | Source: Ambulatory Visit | Attending: Internal Medicine | Admitting: Internal Medicine

## 2018-05-26 DIAGNOSIS — Z1231 Encounter for screening mammogram for malignant neoplasm of breast: Secondary | ICD-10-CM | POA: Diagnosis not present

## 2018-06-01 ENCOUNTER — Telehealth: Payer: Self-pay | Admitting: *Deleted

## 2018-06-01 NOTE — Telephone Encounter (Signed)
Called pt to inform of ldct screening appt on 06-03-18 @ 1145 @ [facility], message left for patient r/t appt.

## 2018-06-03 ENCOUNTER — Ambulatory Visit
Admission: RE | Admit: 2018-06-03 | Discharge: 2018-06-03 | Disposition: A | Discharge status: Home / Self Care | Payer: Medicare Other | Source: Ambulatory Visit | Attending: Nurse Practitioner | Admitting: Nurse Practitioner

## 2018-06-03 DIAGNOSIS — Z122 Encounter for screening for malignant neoplasm of respiratory organs: Secondary | ICD-10-CM | POA: Insufficient documentation

## 2018-06-03 DIAGNOSIS — J432 Centrilobular emphysema: Secondary | ICD-10-CM | POA: Diagnosis not present

## 2018-06-03 DIAGNOSIS — I2510 Atherosclerotic heart disease of native coronary artery without angina pectoris: Secondary | ICD-10-CM | POA: Insufficient documentation

## 2018-06-03 DIAGNOSIS — I700 Atherosclerosis of aorta: Secondary | ICD-10-CM | POA: Insufficient documentation

## 2018-06-04 ENCOUNTER — Telehealth: Payer: Self-pay | Admitting: *Deleted

## 2018-06-04 ENCOUNTER — Encounter: Payer: Self-pay | Admitting: *Deleted

## 2018-06-04 NOTE — Telephone Encounter (Signed)
error 

## 2018-06-04 NOTE — Telephone Encounter (Signed)
Notified patient of LDCT lung cancer screening program results with recommendation for 12 month follow up imaging.  Also notified of incidental findings noted below and is encouraged to discuss further questions with PCP who will receive a copy of this not and/or the CT reports.  Patient verbalized understanding.     IMPRESSION: 1. Lung-RADS 2S, benign appearance or behavior. Continue annual screening with low-dose chest CT without contrast in 12 months. 2. The "S" modifier above refers to potentially clinically significant non lung cancer related findings. Specifically, there is aortic atherosclerosis, in addition to left main and 2 vessel coronary artery disease. Assessment for potential risk factor modification, dietary therapy or pharmacologic therapy may be warranted, if clinically indicated. 3. Mild diffuse bronchial wall thickening with mild centrilobular and paraseptal emphysema; imaging findings suggestive of underlying COPD. 4. 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 Attention at time of repeat low-dose lung cancer screening chest CT is recommended to ensure stability of this finding.  Aortic Atherosclerosis (ICD10-I70.0) and Emphysema (ICD10-J43.9).

## 2019-05-05 ENCOUNTER — Other Ambulatory Visit: Payer: Self-pay | Admitting: Internal Medicine

## 2019-05-05 DIAGNOSIS — Z1231 Encounter for screening mammogram for malignant neoplasm of breast: Secondary | ICD-10-CM

## 2019-05-27 ENCOUNTER — Telehealth: Payer: Self-pay

## 2019-05-27 NOTE — Telephone Encounter (Signed)
Left voicemail with pt to inform her it is time for her annual lung cancer screening. Instructed pt to return call and confirm information prior to CT scan being scheduled.

## 2019-05-30 ENCOUNTER — Telehealth: Payer: Self-pay | Admitting: *Deleted

## 2019-05-30 DIAGNOSIS — Z87891 Personal history of nicotine dependence: Secondary | ICD-10-CM

## 2019-05-30 DIAGNOSIS — Z122 Encounter for screening for malignant neoplasm of respiratory organs: Secondary | ICD-10-CM

## 2019-05-30 NOTE — Telephone Encounter (Signed)
Patient has been notified that annual lung cancer screening low dose CT scan is due currently or will be in near future. Confirmed that patient is within the age range of 55-77, and asymptomatic, (no signs or symptoms of lung cancer). Patient denies illness that would prevent curative treatment for lung cancer if found. Verified smoking history, (current, 53 pack year). The shared decision making visit was done 05/20/16. Patient is agreeable for CT scan being scheduled.

## 2019-06-06 ENCOUNTER — Ambulatory Visit
Admission: RE | Admit: 2019-06-06 | Discharge: 2019-06-06 | Disposition: A | Discharge status: Home / Self Care | Payer: Medicare Other | Source: Ambulatory Visit | Attending: Oncology | Admitting: Oncology

## 2019-06-06 ENCOUNTER — Other Ambulatory Visit: Payer: Self-pay

## 2019-06-06 DIAGNOSIS — Z122 Encounter for screening for malignant neoplasm of respiratory organs: Secondary | ICD-10-CM | POA: Diagnosis not present

## 2019-06-06 DIAGNOSIS — Z87891 Personal history of nicotine dependence: Secondary | ICD-10-CM | POA: Insufficient documentation

## 2019-06-09 ENCOUNTER — Encounter: Payer: Self-pay | Admitting: *Deleted

## 2019-06-22 ENCOUNTER — Ambulatory Visit
Admission: RE | Admit: 2019-06-22 | Discharge: 2019-06-22 | Disposition: A | Discharge status: Home / Self Care | Payer: Medicare Other | Source: Ambulatory Visit | Attending: Internal Medicine | Admitting: Internal Medicine

## 2019-06-22 DIAGNOSIS — Z1231 Encounter for screening mammogram for malignant neoplasm of breast: Secondary | ICD-10-CM | POA: Insufficient documentation

## 2019-06-28 ENCOUNTER — Other Ambulatory Visit: Payer: Self-pay | Admitting: Internal Medicine

## 2019-06-28 DIAGNOSIS — N6310 Unspecified lump in the right breast, unspecified quadrant: Secondary | ICD-10-CM

## 2019-06-28 DIAGNOSIS — R928 Other abnormal and inconclusive findings on diagnostic imaging of breast: Secondary | ICD-10-CM

## 2019-07-05 ENCOUNTER — Ambulatory Visit
Admission: RE | Admit: 2019-07-05 | Discharge: 2019-07-05 | Disposition: A | Discharge status: Home / Self Care | Payer: Medicare Other | Source: Ambulatory Visit | Attending: Internal Medicine | Admitting: Internal Medicine

## 2019-07-05 DIAGNOSIS — R928 Other abnormal and inconclusive findings on diagnostic imaging of breast: Secondary | ICD-10-CM | POA: Insufficient documentation

## 2019-07-05 DIAGNOSIS — N6310 Unspecified lump in the right breast, unspecified quadrant: Secondary | ICD-10-CM | POA: Insufficient documentation

## 2019-08-04 ENCOUNTER — Ambulatory Visit: Payer: Medicare Other | Attending: Internal Medicine

## 2019-08-04 DIAGNOSIS — Z20822 Contact with and (suspected) exposure to covid-19: Secondary | ICD-10-CM

## 2019-08-05 LAB — NOVEL CORONAVIRUS, NAA: SARS-CoV-2, NAA: NOT DETECTED

## 2019-08-24 ENCOUNTER — Other Ambulatory Visit: Payer: Self-pay | Admitting: Unknown Physician Specialty

## 2019-08-24 DIAGNOSIS — R42 Dizziness and giddiness: Secondary | ICD-10-CM

## 2019-08-26 ENCOUNTER — Other Ambulatory Visit: Payer: Self-pay

## 2019-08-26 ENCOUNTER — Ambulatory Visit
Admission: RE | Admit: 2019-08-26 | Discharge: 2019-08-26 | Disposition: A | Discharge status: Home / Self Care | Payer: Medicare PPO | Source: Ambulatory Visit | Attending: Unknown Physician Specialty | Admitting: Unknown Physician Specialty

## 2019-08-26 DIAGNOSIS — R42 Dizziness and giddiness: Secondary | ICD-10-CM | POA: Insufficient documentation

## 2019-10-11 ENCOUNTER — Other Ambulatory Visit: Payer: Self-pay | Admitting: Neurosurgery

## 2019-10-11 DIAGNOSIS — M4722 Other spondylosis with radiculopathy, cervical region: Secondary | ICD-10-CM

## 2019-10-21 ENCOUNTER — Other Ambulatory Visit: Payer: Self-pay

## 2019-10-21 ENCOUNTER — Ambulatory Visit
Admission: RE | Admit: 2019-10-21 | Discharge: 2019-10-21 | Disposition: A | Discharge status: Home / Self Care | Payer: Medicare PPO | Source: Ambulatory Visit | Attending: Neurosurgery | Admitting: Neurosurgery

## 2019-10-21 DIAGNOSIS — M4722 Other spondylosis with radiculopathy, cervical region: Secondary | ICD-10-CM | POA: Diagnosis present

## 2019-11-21 ENCOUNTER — Other Ambulatory Visit: Payer: Self-pay | Admitting: Neurology

## 2019-11-21 ENCOUNTER — Other Ambulatory Visit: Payer: Self-pay | Admitting: Internal Medicine

## 2019-11-21 DIAGNOSIS — R42 Dizziness and giddiness: Secondary | ICD-10-CM

## 2019-12-02 ENCOUNTER — Ambulatory Visit: Payer: Medicare PPO

## 2019-12-13 ENCOUNTER — Ambulatory Visit
Admission: RE | Admit: 2019-12-13 | Discharge: 2019-12-13 | Disposition: A | Discharge status: Home / Self Care | Payer: Medicare PPO | Source: Ambulatory Visit | Attending: Neurology | Admitting: Neurology

## 2019-12-13 ENCOUNTER — Other Ambulatory Visit: Payer: Self-pay

## 2019-12-13 DIAGNOSIS — R42 Dizziness and giddiness: Secondary | ICD-10-CM | POA: Diagnosis present

## 2019-12-13 MED ORDER — GADOBUTROL 1 MMOL/ML IV SOLN
5.0000 mL | Freq: Once | INTRAVENOUS | Status: AC | PRN
  Administered 2019-12-13: 14:00:00 5 mL via INTRAVENOUS

## 2020-06-06 ENCOUNTER — Other Ambulatory Visit: Payer: Self-pay | Admitting: Internal Medicine

## 2020-06-06 ENCOUNTER — Telehealth: Payer: Self-pay

## 2020-06-06 DIAGNOSIS — Z87891 Personal history of nicotine dependence: Secondary | ICD-10-CM

## 2020-06-06 DIAGNOSIS — Z1231 Encounter for screening mammogram for malignant neoplasm of breast: Secondary | ICD-10-CM

## 2020-06-06 DIAGNOSIS — Z122 Encounter for screening for malignant neoplasm of respiratory organs: Secondary | ICD-10-CM

## 2020-06-06 NOTE — Telephone Encounter (Signed)
Patient contacted and scheduled for CT lung screening scan on 06/12/2020 at 4PM. Address to [facility] given to patient. Verified with patient smoking status. She is a current smoker and is smoking about 1 pack per day currently and has been a smoker for approximately 52 years.

## 2020-06-07 NOTE — Telephone Encounter (Signed)
Current smoker, 54 pack year

## 2020-06-07 NOTE — Addendum Note (Signed)
Addended by: Lieutenant Diego on: 06/07/2020 02:28 PM   Modules accepted: Orders

## 2020-06-12 ENCOUNTER — Ambulatory Visit
Admission: RE | Admit: 2020-06-12 | Discharge: 2020-06-12 | Disposition: A | Discharge status: Home / Self Care | Payer: Medicare PPO | Source: Ambulatory Visit | Attending: Nurse Practitioner | Admitting: Nurse Practitioner

## 2020-06-12 ENCOUNTER — Other Ambulatory Visit: Payer: Self-pay

## 2020-06-12 DIAGNOSIS — Z122 Encounter for screening for malignant neoplasm of respiratory organs: Secondary | ICD-10-CM | POA: Diagnosis present

## 2020-06-12 DIAGNOSIS — Z87891 Personal history of nicotine dependence: Secondary | ICD-10-CM

## 2020-06-13 ENCOUNTER — Encounter: Payer: Self-pay | Admitting: *Deleted

## 2020-07-17 ENCOUNTER — Other Ambulatory Visit: Payer: Self-pay

## 2020-07-17 ENCOUNTER — Ambulatory Visit
Admission: RE | Admit: 2020-07-17 | Discharge: 2020-07-17 | Disposition: A | Discharge status: Home / Self Care | Payer: Medicare PPO | Source: Ambulatory Visit | Attending: Internal Medicine | Admitting: Internal Medicine

## 2020-07-17 DIAGNOSIS — Z1231 Encounter for screening mammogram for malignant neoplasm of breast: Secondary | ICD-10-CM | POA: Diagnosis present

## 2020-07-23 ENCOUNTER — Other Ambulatory Visit: Payer: Self-pay | Admitting: Internal Medicine

## 2020-07-25 ENCOUNTER — Other Ambulatory Visit: Payer: Self-pay | Admitting: Internal Medicine

## 2020-07-25 DIAGNOSIS — R928 Other abnormal and inconclusive findings on diagnostic imaging of breast: Secondary | ICD-10-CM

## 2020-08-08 ENCOUNTER — Ambulatory Visit
Admission: RE | Admit: 2020-08-08 | Discharge: 2020-08-08 | Disposition: A | Discharge status: Home / Self Care | Payer: Medicare PPO | Source: Ambulatory Visit | Attending: Internal Medicine | Admitting: Internal Medicine

## 2020-08-08 ENCOUNTER — Other Ambulatory Visit: Payer: Self-pay

## 2020-08-08 DIAGNOSIS — R928 Other abnormal and inconclusive findings on diagnostic imaging of breast: Secondary | ICD-10-CM

## 2020-08-14 ENCOUNTER — Other Ambulatory Visit: Payer: Self-pay | Admitting: Internal Medicine

## 2020-08-14 DIAGNOSIS — R928 Other abnormal and inconclusive findings on diagnostic imaging of breast: Secondary | ICD-10-CM

## 2020-11-20 IMAGING — US US CAROTID DUPLEX BILAT
1 series · 13 of 24 positions shown · non-contrast
Comparison: 07/13/2014

CLINICAL DATA: Syncope. Hypertension, hyperlipidemia, previous
tobacco abuse.

EXAM:
BILATERAL CAROTID DUPLEX ULTRASOUND
TECHNIQUE: Gray scale imaging, color Doppler and duplex ultrasound were
performed of bilateral carotid and vertebral arteries in the neck.

[Series 1: us carotid duplex bilat · 13 of 64 slices shown]
[im 1/64]
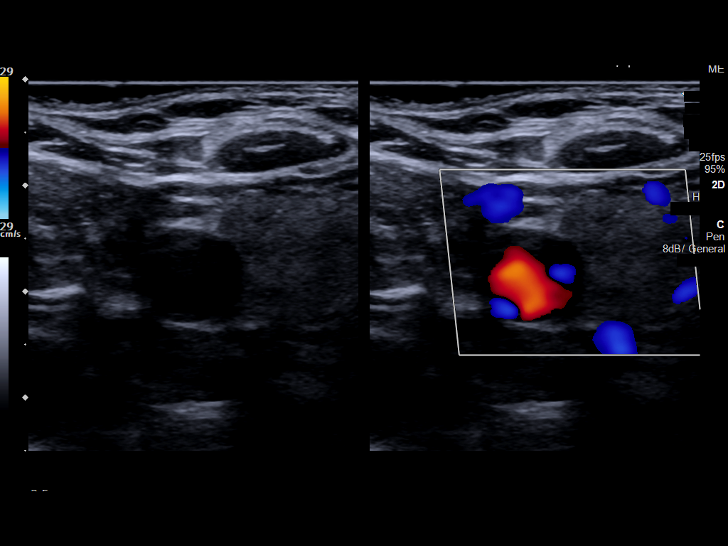
[im 6/64]
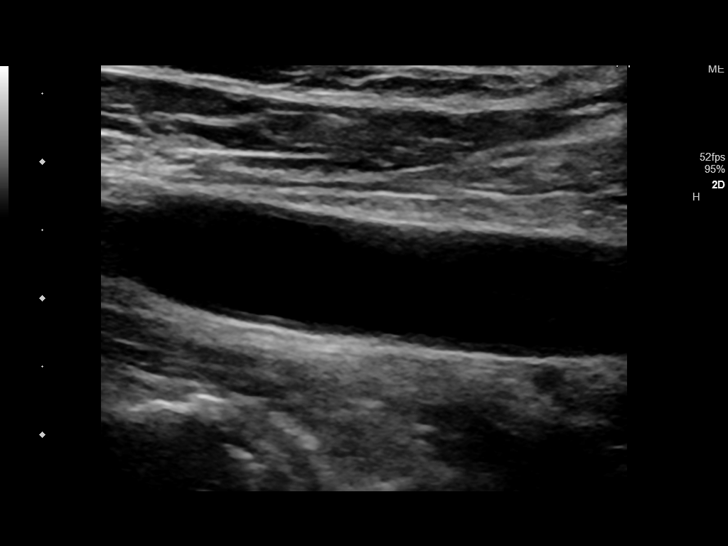
[im 11/64]
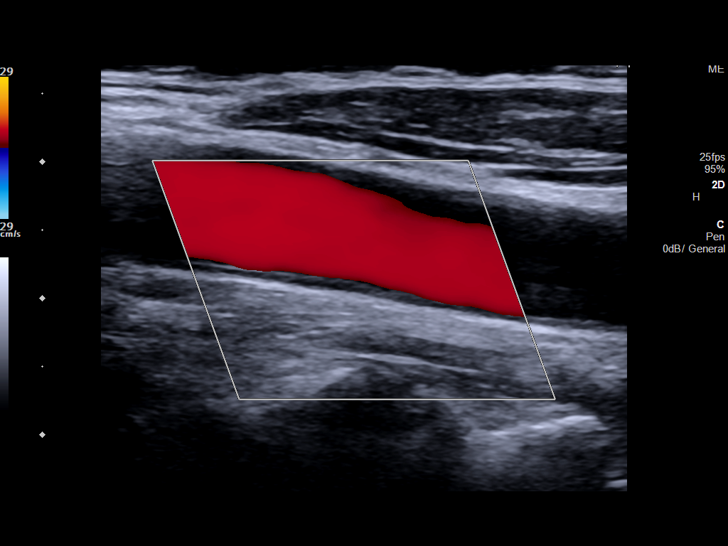
[im 17/64]
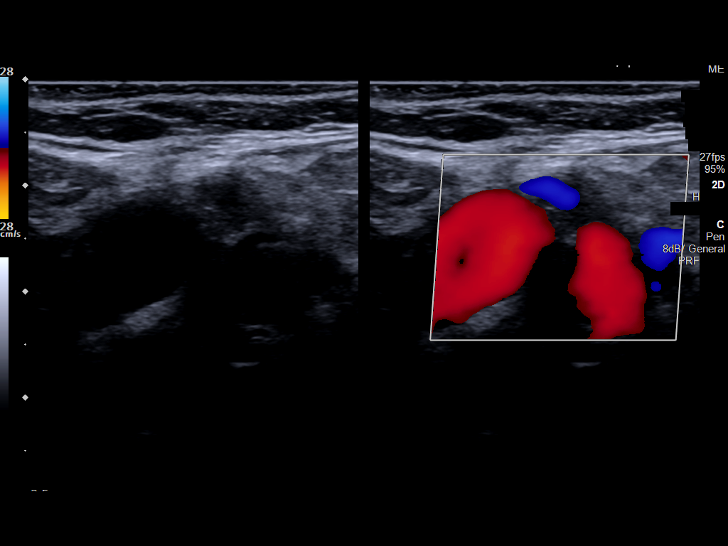
[im 22/64]
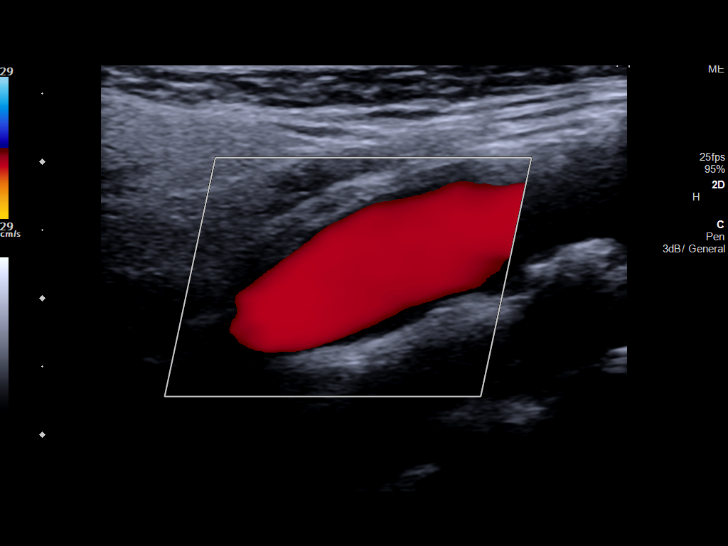
[im 28/64]
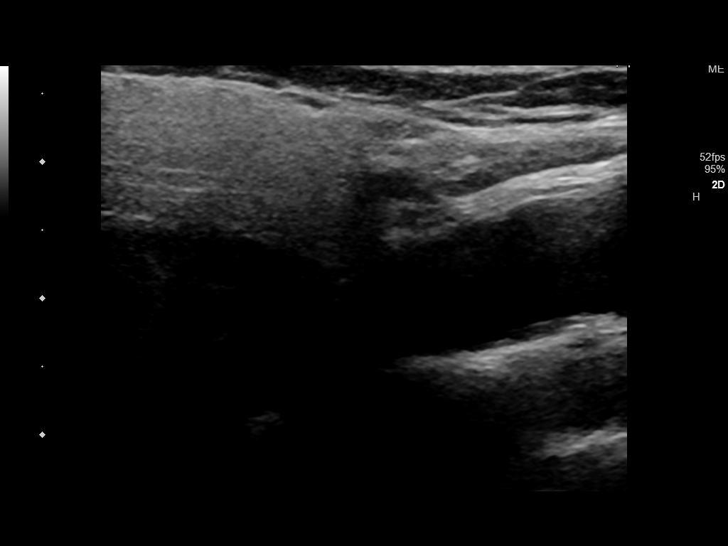
[im 33/64]
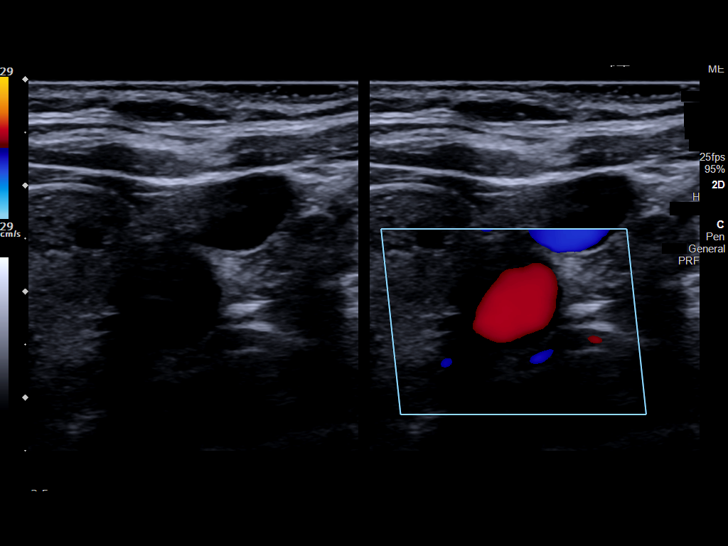
[im 36/64]
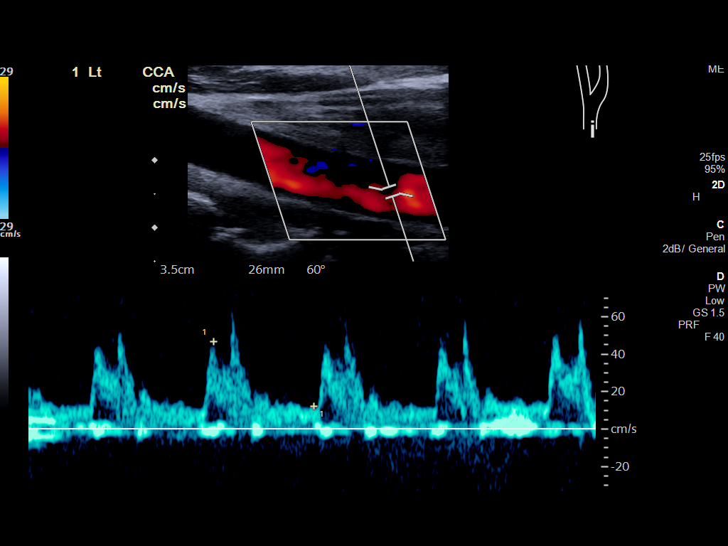
[im 42/64]
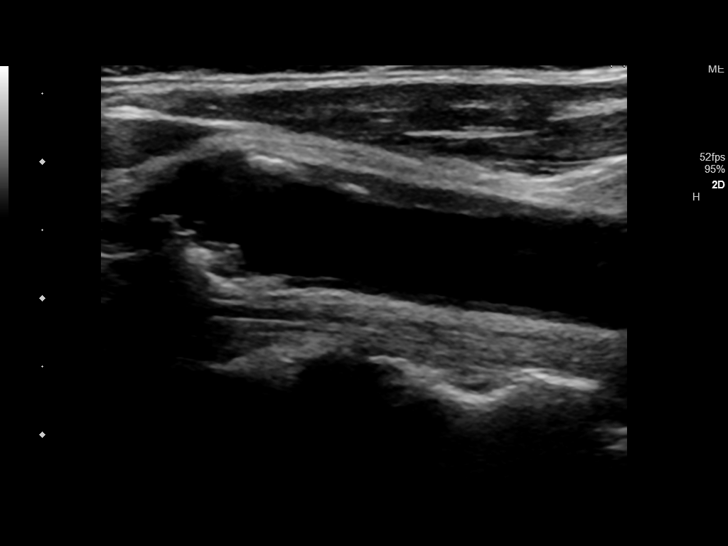
[im 47/64]
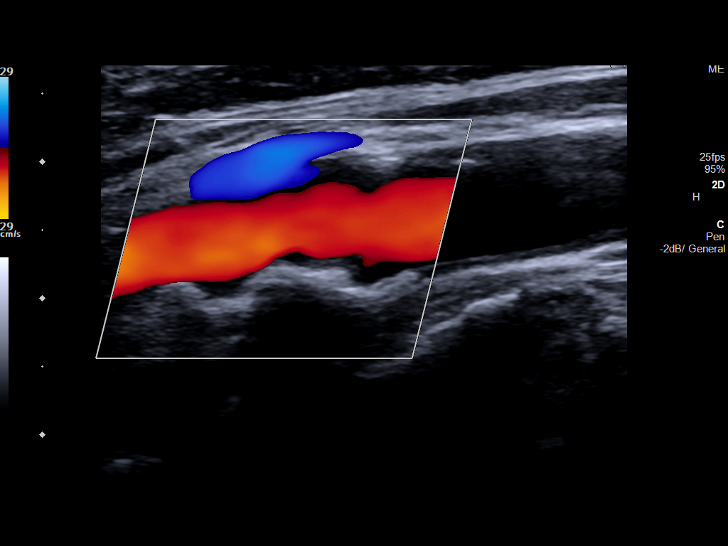
[im 53/64]
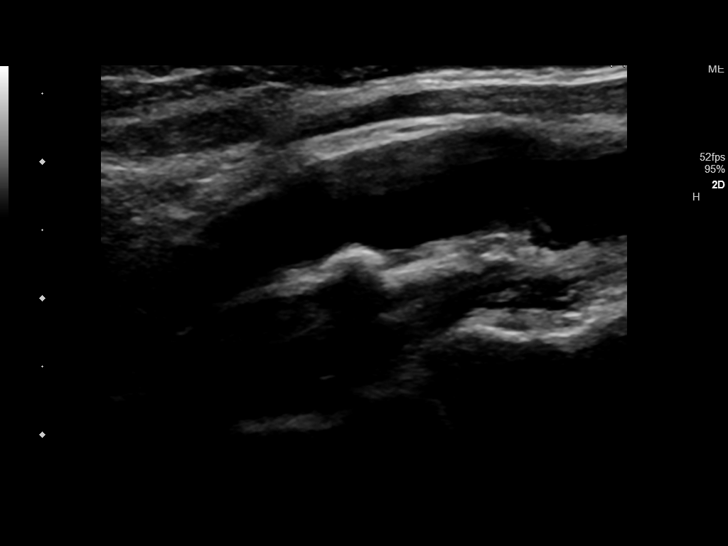
[im 58/64]
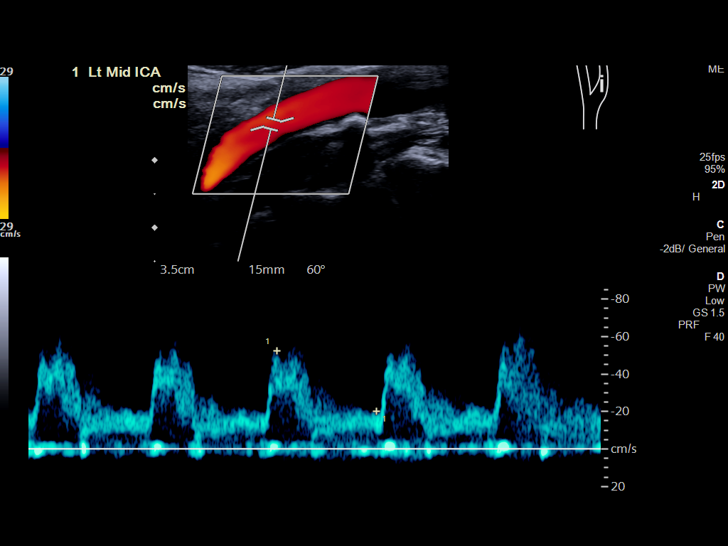
[im 64/64]
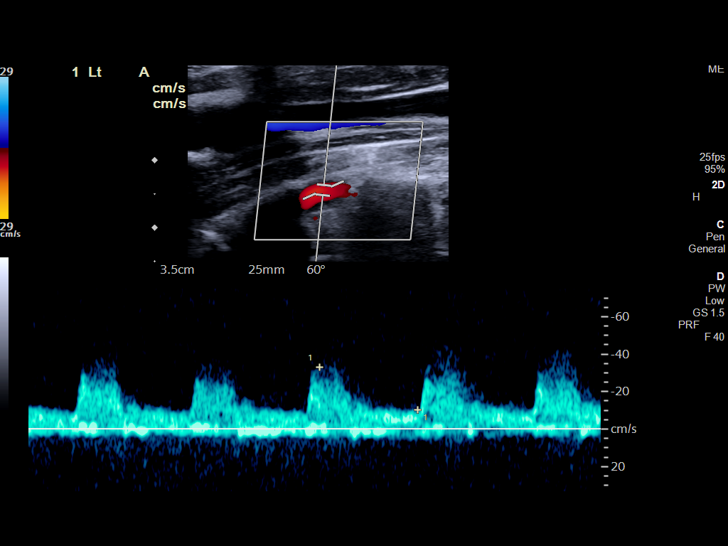

[13 of 24 positions shown; findings below may reference images not displayed]

FINDINGS: Criteria: Quantification of carotid stenosis is based on velocity
parameters that correlate the residual internal carotid diameter
with NASCET-based stenosis levels, using the diameter of the distal
internal carotid lumen as the denominator for stenosis measurement.

The following velocity measurements were obtained:

RIGHT

ICA: 95/38 cm/sec

CCA: 112/14 cm/sec

SYSTOLIC ICA/CCA RATIO:

ECA: 59 cm/sec

LEFT

ICA: 82/29 cm/sec

CCA: 54/17 cm/sec

SYSTOLIC ICA/CCA RATIO:

ECA: 102 cm/sec

RIGHT CAROTID ARTERY: Scattered eccentric plaque in the common
carotid artery, bulb, and proximal ICA. No high-grade stenosis.
Normal waveforms and color Doppler signal.

RIGHT VERTEBRAL ARTERY:  Normal flow direction and waveform.

LEFT CAROTID ARTERY: Intimal thickening through the common carotid
artery. Eccentric plaque in the distal common carotid, bulb, and
proximal ICA without high-grade stenosis. Focal mild stenosis at the
ECA origin. Normal waveforms and color Doppler signal throughout.

LEFT VERTEBRAL ARTERY:  Normal flow direction and waveform.
IMPRESSION: 1. Bilateral carotid bifurcation plaque resulting in less than 50%
diameter ICA stenosis.
2. Antegrade bilateral vertebral arterial flow.

## 2020-12-13 ENCOUNTER — Other Ambulatory Visit: Admission: RE | Admit: 2020-12-13 | Payer: Medicare PPO | Source: Ambulatory Visit

## 2020-12-21 ENCOUNTER — Other Ambulatory Visit: Payer: Self-pay | Admitting: General Surgery

## 2020-12-21 DIAGNOSIS — R928 Other abnormal and inconclusive findings on diagnostic imaging of breast: Secondary | ICD-10-CM

## 2021-01-15 IMAGING — MR MR CERVICAL SPINE W/O CM
5 series · 39 of 48 positions shown · non-contrast
Comparison: 05/08/2015

CLINICAL DATA: Recurring right shoulder pain for 2-3 months

EXAM:
MRI CERVICAL SPINE WITHOUT CONTRAST
TECHNIQUE: Multiplanar, multisequence MR imaging of the cervical spine was
performed. No intravenous contrast was administered.

[Series 5: T2 · sagittal · 3.0mm · 0.62mm/px · 6 of 15 slices shown (1 of 2)]
[im 1/15]
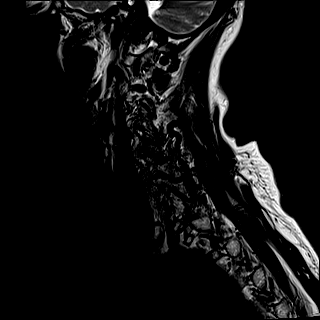
[im 3/15]
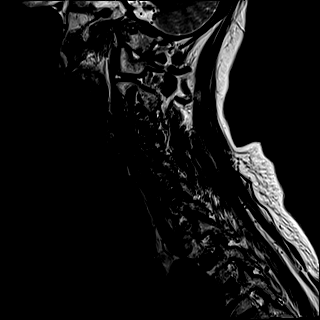
[im 6/15]
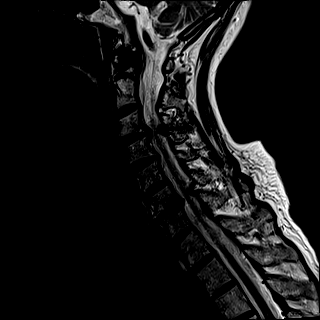
[im 9/15]
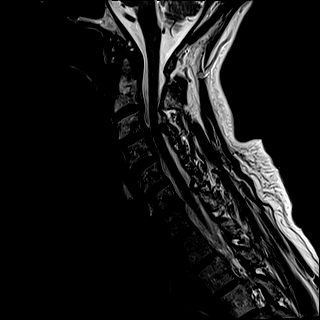
[im 12/15]
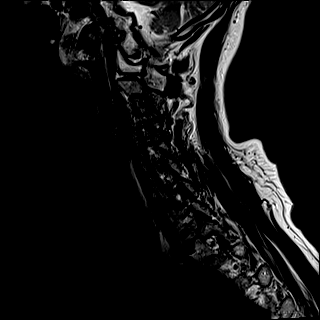
[im 15/15]
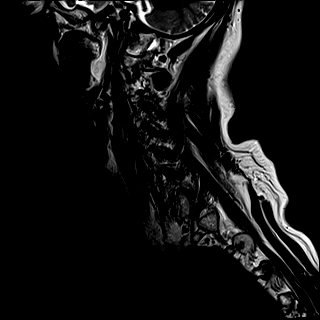

[Series 6: FLAIR · sagittal · 3.0mm · 0.78mm/px · 7 of 15 slices shown]
[im 1/15]
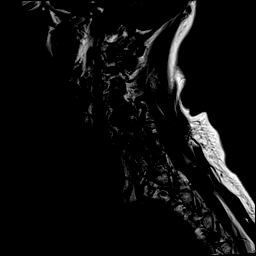
[im 3/15]
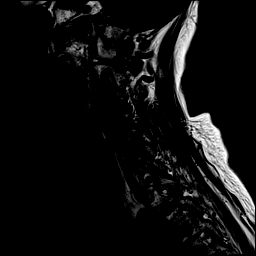
[im 5/15]
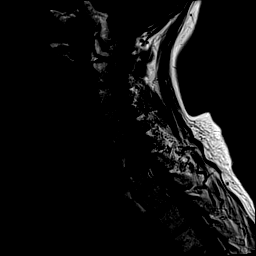
[im 8/15]
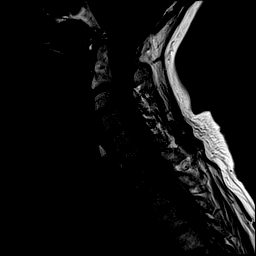
[im 10/15]
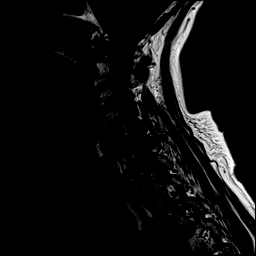
[im 12/15]
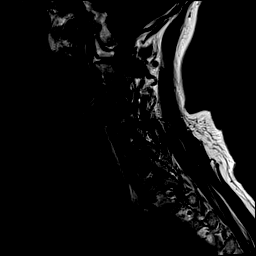
[im 15/15]
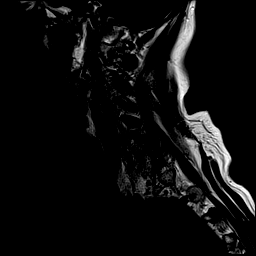

[Series 7: STIR · sagittal · 3.0mm · 0.62mm/px · 7 of 15 slices shown]
[im 1/15]
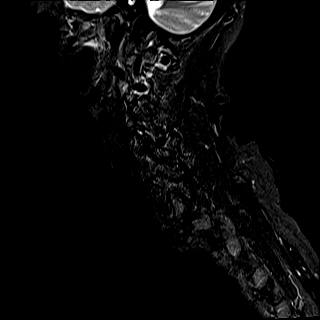
[im 3/15]
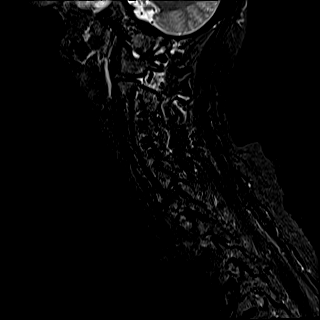
[im 5/15]
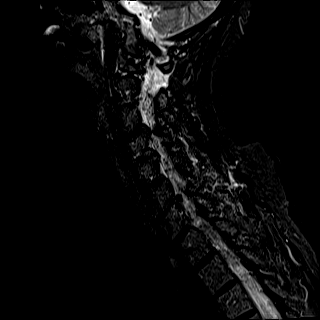
[im 8/15]
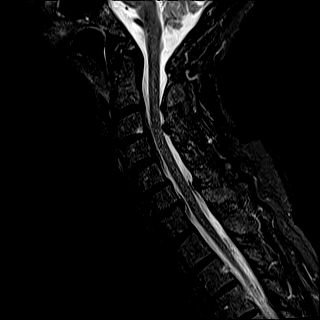
[im 10/15]
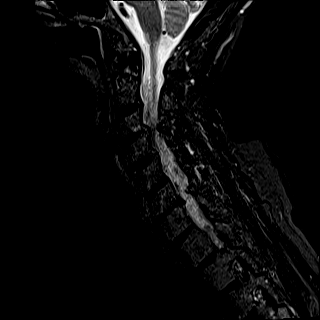
[im 12/15]
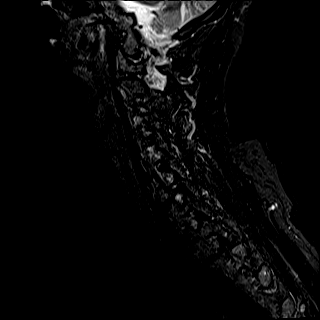
[im 15/15]
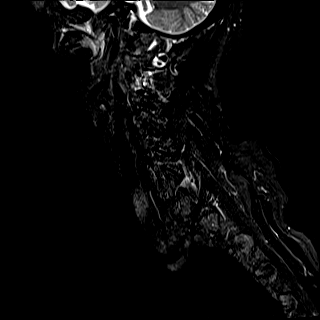

[Series 8: T2 · axial · 3.0mm · 0.70mm/px · z∈[-44,+55]mm · 11 of 31 slices shown (2 of 2)]
[im 1/31]
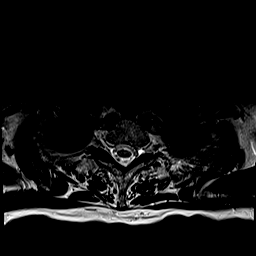
[im 3/31]
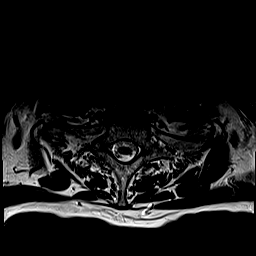
[im 5/31]
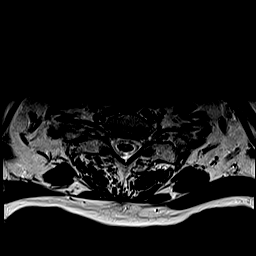
[im 7/31]
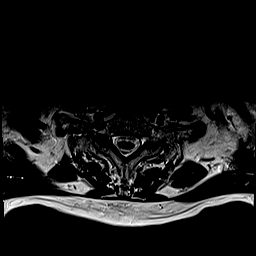
[im 10/31]
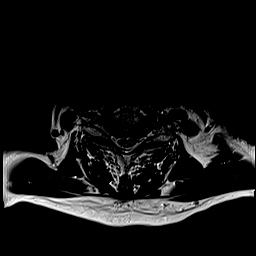
[im 12/31]
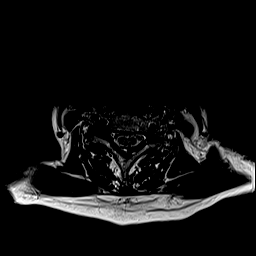
[im 14/31]
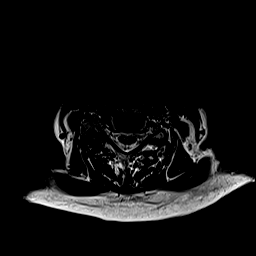
[im 17/31]
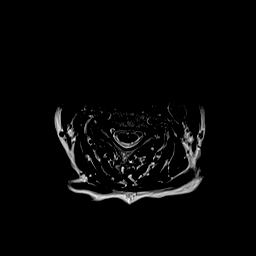
[im 21/31]
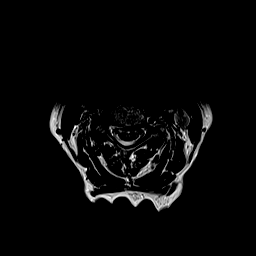
[im 26/31]
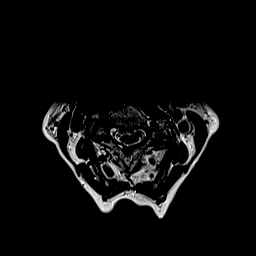
[im 31/31]
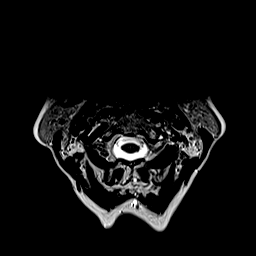

[Series 9: ax mpgr · axial · 3.0mm · 0.35mm/px · z∈[-44,+55]mm · 8 of 31 slices shown]
[im 1/31]
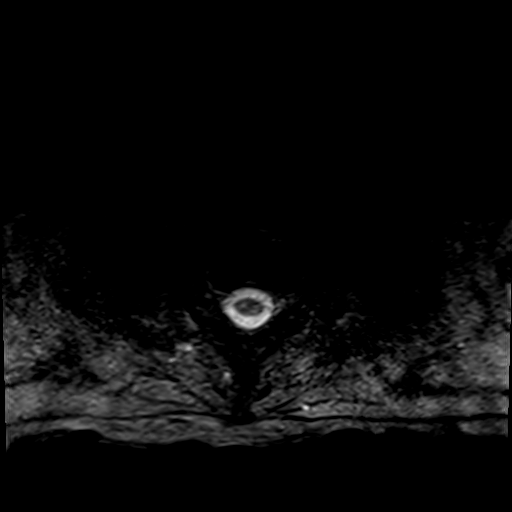
[im 5/31]
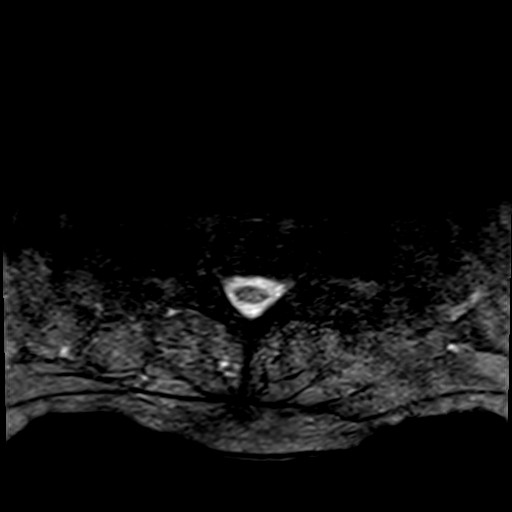
[im 10/31]
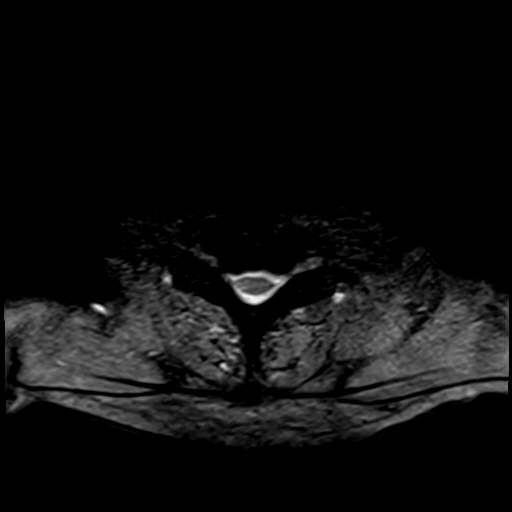
[im 14/31]
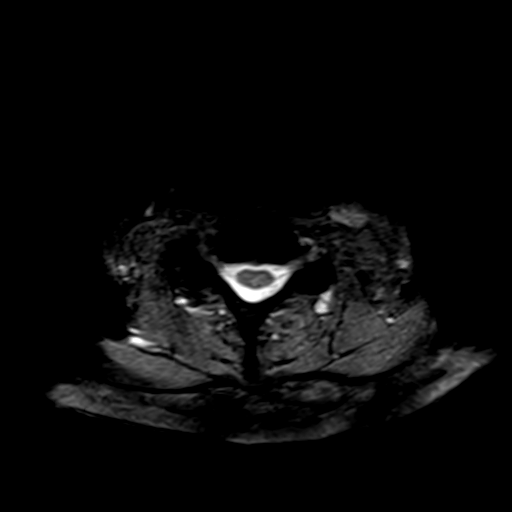
[im 17/31]
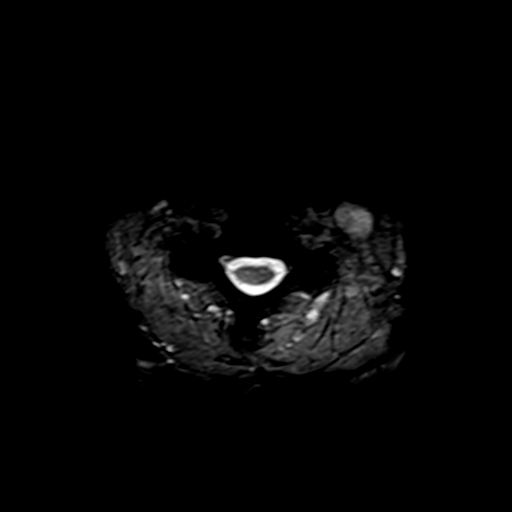
[im 21/31]
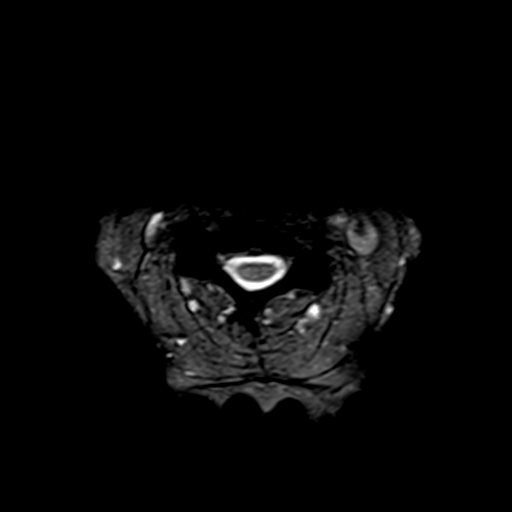
[im 26/31]
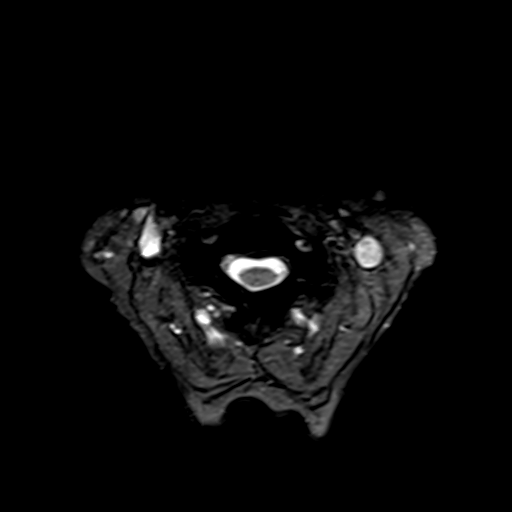
[im 31/31]
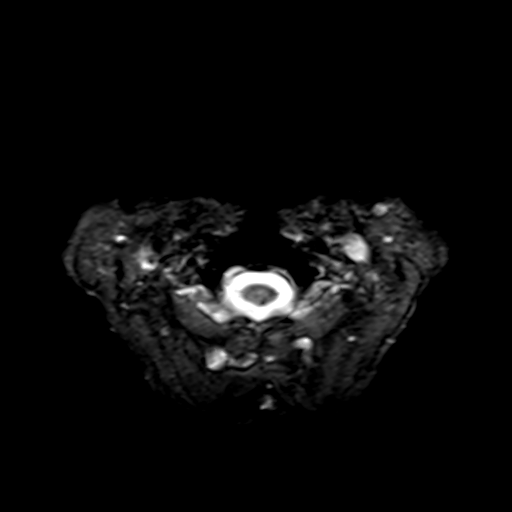

[39 of 48 positions shown; findings below may reference images not displayed]

FINDINGS: Alignment: Stable mild anterolisthesis at C4-5 and C5-6.

Vertebrae: No fracture, evidence of discitis, or bone lesion.

Cord: Normal signal and morphology.

Posterior Fossa, vertebral arteries, paraspinal tissues: Negative.

Disc levels:

C2-3: Degenerative facet spurring which is bulky on the left. Patent
canal and foramina. Small central disc protrusion that is stable

C3-4: Facet degeneration with spurring and ligamentum flavum
thickening. There is a central disc protrusion. Findings are
progressive with spinal stenosis and cord crowding. Right more than
left foraminal stenosis, high-grade on the right.

C4-5: Degenerative facet spurring with mild anterolisthesis.
Uncovertebral spurring asymmetric to the right. Right foraminal
impingement. The canal and left foramen are patent

C5-6: Facet degeneration with spurring and anterolisthesis. The
canal and foramina are patent

C6-7: Disc narrowing and bulging with uncovertebral ridging.
Ligamentum flavum thickening. Mild spinal stenosis. Advanced right
foraminal impingement and mild left foraminal narrowing

C7-T1:Negative compared to the other levels.
IMPRESSION: 1. Diffuse disc and facet degeneration with C4-5 and C5-6 listhesis,
progressed from 8211.
2. On the symptomatic right side there is foraminal impingement at
C3-4, C4-5, and C6-7.
3. Spinal stenosis is greatest at C3-4 where there is cord crowding.

## 2021-01-16 ENCOUNTER — Other Ambulatory Visit: Payer: Self-pay

## 2021-01-16 ENCOUNTER — Ambulatory Visit
Admission: RE | Admit: 2021-01-16 | Discharge: 2021-01-16 | Disposition: A | Discharge status: Home / Self Care | Payer: Medicare PPO | Source: Ambulatory Visit | Attending: General Surgery | Admitting: General Surgery

## 2021-01-16 ENCOUNTER — Other Ambulatory Visit: Payer: Medicare PPO

## 2021-01-16 DIAGNOSIS — R928 Other abnormal and inconclusive findings on diagnostic imaging of breast: Secondary | ICD-10-CM

## 2021-03-21 ENCOUNTER — Other Ambulatory Visit: Payer: Self-pay | Admitting: Gastroenterology

## 2021-03-21 ENCOUNTER — Other Ambulatory Visit: Payer: Self-pay

## 2021-03-21 ENCOUNTER — Ambulatory Visit: Payer: Medicare PPO | Admitting: Gastroenterology

## 2021-03-21 ENCOUNTER — Encounter: Payer: Self-pay | Admitting: Gastroenterology

## 2021-03-21 VITALS — BP 138/73 | HR 101 | Temp 97.6°F | Ht 65.5 in | Wt 134.2 lb

## 2021-03-21 DIAGNOSIS — I10 Essential (primary) hypertension: Secondary | ICD-10-CM | POA: Insufficient documentation

## 2021-03-21 DIAGNOSIS — Z86010 Personal history of colonic polyps: Secondary | ICD-10-CM | POA: Diagnosis not present

## 2021-03-21 DIAGNOSIS — E785 Hyperlipidemia, unspecified: Secondary | ICD-10-CM | POA: Insufficient documentation

## 2021-03-21 DIAGNOSIS — J449 Chronic obstructive pulmonary disease, unspecified: Secondary | ICD-10-CM | POA: Insufficient documentation

## 2021-03-21 DIAGNOSIS — K219 Gastro-esophageal reflux disease without esophagitis: Secondary | ICD-10-CM | POA: Insufficient documentation

## 2021-03-21 MED ORDER — PANTOPRAZOLE SODIUM 40 MG PO TBEC: 40.0000 mg | DELAYED_RELEASE_TABLET | Freq: Every day | ORAL | 6 refills | Status: AC

## 2021-03-21 MED ORDER — CLENPIQ 10-3.5-12 MG-GM -GM/160ML PO SOLN: 320.0000 mL | ORAL | 0 refills | Status: AC

## 2021-03-21 NOTE — H&P (View-Only) (Signed)
Gastroenterology Consultation  Referring Provider:     Adin Hector, MD Primary Care Physician:  Adin Hector, MD Primary Gastroenterologist:  Dr. Allen Norris     Reason for Consultation:     History of colon polyps and GERD        HPI:   Patricia Reid is a 80 y.o. y/o female referred for consultation & management of history of colon polyps and GERD by Dr. Caryl Comes, Wendelyn Breslow III, MD. This patient comes in today after being seen in the past by Dr. Vira Agar.  The patient had an EGD and colonoscopy back in 2016 with a recommended repeat of the procedures in 5 years.  The patient was set up for these procedures by Dr. Alice Reichert for the 24th of this month and there is correspondence between her and Dr. Bary Castilla about him doing her procedures back in May.  The patient had some other surgeries and elected to hold off on the procedures at that time.  The patient was recently found to have iron deficiency anemia and a referral was put in for the patient to see me.  The patient's most recent CBC showed:  Hemoglobin 12.0 - 15.0 gm/dL 11.1 Low    Hematocrit 35.0 - 47.0 % 34.9 Low         The patient reports that she had changed her diet and started to have a gnawing pain in her epigastric area but then she went to watching what she ate again and reports that her symptoms got better and she is not having the abdominal pain as much.  There is no report of any unexplained weight loss fevers chills nausea vomiting black stools or bloody stools.  The patient reports that she has had multiple colonoscopies in the past and they have all showed polyps.  Past Medical History:  Diagnosis Date   Arthritis    COPD (chronic obstructive pulmonary disease) ([place])    slim to none   GERD (gastroesophageal reflux disease)    Hypertension     Past Surgical History:  Procedure Laterality Date   BACK SURGERY  2008, 2016   CHOLECYSTECTOMY     COLONOSCOPY     COLONOSCOPY WITH PROPOFOL N/A 01/24/2015   Procedure:  COLONOSCOPY WITH PROPOFOL;  Surgeon: Manya Silvas, MD;  Location: [facility];  Service: Endoscopy;  Laterality: N/A;   ESOPHAGOGASTRODUODENOSCOPY N/A 01/24/2015   Procedure: ESOPHAGOGASTRODUODENOSCOPY (EGD);  Surgeon: Manya Silvas, MD;  Location: [facility] ENDOSCOPY;  Service: Endoscopy;  Laterality: N/A;   SAVORY DILATION N/A 01/24/2015   Procedure: SAVORY DILATION;  Surgeon: Manya Silvas, MD;  Location: [facility] ENDOSCOPY;  Service: Endoscopy;  Laterality: N/A;   TONSILLECTOMY      Prior to Admission medications   Medication Sig Start Date End Date Taking? Authorizing Provider  azelastine (ASTELIN) 0.1 % nasal spray Place 1 spray into both nostrils 2 (two) times daily. 07/18/14   [provider]  cholecalciferol (VITAMIN D) 1000 UNITS tablet Take 1,000 Units by mouth daily.    [provider]  cyclobenzaprine (FLEXERIL) 10 MG tablet Take 1 tablet (10 mg total) by mouth 3 (three) times daily as needed for muscle spasms. 01/01/17   Newman Pies, MD  esomeprazole (NEXIUM) 40 MG capsule Take 40 mg by mouth 2 (two) times daily before a meal.    [provider]  fluticasone (FLONASE) 50 MCG/ACT nasal spray Place 2 sprays into both nostrils 2 (two) times daily.     [provider]  gabapentin (NEURONTIN) 300 MG capsule Take 300 mg by mouth 3 (three) times daily.  06/07/14   [provider]  hydrochlorothiazide (HYDRODIURIL) 25 MG tablet Take 12.5 mg by mouth daily.  05/31/14   [provider]  HYDROmorphone (DILAUDID) 4 MG tablet Take 4 mg by mouth every 4 (four) hours as needed for severe pain.    [provider]  HYDROmorphone (DILAUDID) 4 MG tablet Take 1 tablet (4 mg total) by mouth every 4 (four) hours as needed for severe pain. 01/01/17   Newman Pies, MD  loratadine (CLARITIN) 10 MG tablet Take 10 mg by mouth daily as needed for allergies.     [provider]  Multiple Vitamin (MULTIVITAMIN WITH MINERALS) TABS  tablet Take 1 tablet by mouth daily.    [provider]  polyethylene glycol powder (GLYCOLAX/MIRALAX) powder Take 17 g by mouth daily.    [provider]  temazepam (RESTORIL) 15 MG capsule Take 15 mg by mouth at bedtime as needed for sleep.     [provider]  traMADol (ULTRAM) 50 MG tablet Take 25-50 mg by mouth 3 (three) times daily as needed for severe pain.     [provider]    Family History  Problem Relation Age of Onset   Breast cancer Neg Hx      Social History   Tobacco Use   Smoking status: Every Day    Packs/day: 1.00    Years: 52.00    Pack years: 52.00    Types: Cigarettes   Smokeless tobacco: Never  Substance Use Topics   Alcohol use: Yes    Comment: occasionally   Drug use: No    Allergies as of 03/21/2021 - Review Complete 12/31/2016  Allergen Reaction Noted   Simvastatin Other (See Comments) 01/23/2015   Codeine Other (See Comments) 08/14/2014    Review of Systems:    All systems reviewed and negative except where noted in HPI.   Physical Exam:  There were no vitals taken for this visit. No LMP recorded. Patient is postmenopausal. General:   Alert,  Well-developed, well-nourished, pleasant and cooperative in NAD Head:  Normocephalic and atraumatic. Eyes:  Sclera clear, no icterus.   Conjunctiva pink. Ears:  Normal auditory acuity. Neck:  Supple; no masses or thyromegaly. Lungs:  Respirations even and unlabored.  Clear throughout to auscultation.   No wheezes, crackles, or rhonchi. No acute distress. Heart:  Regular rate and rhythm; no murmurs, clicks, rubs, or gallops. Abdomen:  Normal bowel sounds.  No bruits.  Soft, non-tender and non-distended without masses, hepatosplenomegaly or hernias noted.  No guarding or rebound tenderness.  Negative Carnett sign.   Rectal:  Deferred.  Pulses:  Normal pulses noted. Extremities:  No clubbing or edema.  No cyanosis. Neurologic:  Alert and oriented x3;  grossly normal  neurologically. Skin:  Intact without significant lesions or rashes.  No jaundice. Lymph Nodes:  No significant cervical adenopathy. Psych:  Alert and cooperative. Normal mood and affect.  Imaging Studies: No results found.  Assessment and Plan:   NAHLANI GRIGG is a 80 y.o. y/o female who comes today with a history of colon polyps and abdominal discomfort.  The patient's symptoms were related to her food but due to her being on Nexium for some time she will be switched to Protonix to see if that helps her symptoms.  The patient has been told that if her abdominal pain does not get better that when she comes  for her colonoscopy due to her history of polyps she can tell me that time if the medication is not working for her and that that time we may decide to proceed with an upper endoscopy at the same time.  The patient has been explained the plan and agrees with it.    Lucilla Lame, MD. Marval Regal    Note: This dictation was prepared with Dragon dictation along with smaller phrase technology. Any transcriptional errors that result from this process are unintentional.

## 2021-03-21 NOTE — Progress Notes (Signed)
Gastroenterology Consultation  Referring Provider:     Adin Hector, MD Primary Care Physician:  Adin Hector, MD Primary Gastroenterologist:  Dr. Allen Norris     Reason for Consultation:     History of colon polyps and GERD        HPI:   Patricia Reid is a 80 y.o. y/o female referred for consultation & management of history of colon polyps and GERD by Dr. Caryl Comes, Wendelyn Breslow III, MD. This patient comes in today after being seen in the past by Dr. Vira Agar.  The patient had an EGD and colonoscopy back in 2016 with a recommended repeat of the procedures in 5 years.  The patient was set up for these procedures by Dr. Alice Reichert for the 24th of this month and there is correspondence between her and Dr. Bary Castilla about him doing her procedures back in May.  The patient had some other surgeries and elected to hold off on the procedures at that time.  The patient was recently found to have iron deficiency anemia and a referral was put in for the patient to see me.  The patient's most recent CBC showed:  Hemoglobin 12.0 - 15.0 gm/dL 11.1 Low    Hematocrit 35.0 - 47.0 % 34.9 Low         The patient reports that she had changed her diet and started to have a gnawing pain in her epigastric area but then she went to watching what she ate again and reports that her symptoms got better and she is not having the abdominal pain as much.  There is no report of any unexplained weight loss fevers chills nausea vomiting black stools or bloody stools.  The patient reports that she has had multiple colonoscopies in the past and they have all showed polyps.  Past Medical History:  Diagnosis Date   Arthritis    COPD (chronic obstructive pulmonary disease) ([place])    slim to none   GERD (gastroesophageal reflux disease)    Hypertension     Past Surgical History:  Procedure Laterality Date   BACK SURGERY  2008, 2016   CHOLECYSTECTOMY     COLONOSCOPY     COLONOSCOPY WITH PROPOFOL N/A 01/24/2015   Procedure:  COLONOSCOPY WITH PROPOFOL;  Surgeon: Manya Silvas, MD;  Location: [place];  Service: Endoscopy;  Laterality: N/A;   ESOPHAGOGASTRODUODENOSCOPY N/A 01/24/2015   Procedure: ESOPHAGOGASTRODUODENOSCOPY (EGD);  Surgeon: Manya Silvas, MD;  Location: [facility] ENDOSCOPY;  Service: Endoscopy;  Laterality: N/A;   SAVORY DILATION N/A 01/24/2015   Procedure: SAVORY DILATION;  Surgeon: Manya Silvas, MD;  Location: [facility] ENDOSCOPY;  Service: Endoscopy;  Laterality: N/A;   TONSILLECTOMY      Prior to Admission medications   Medication Sig Start Date End Date Taking? Authorizing Provider  azelastine (ASTELIN) 0.1 % nasal spray Place 1 spray into both nostrils 2 (two) times daily. 07/18/14   [provider]  cholecalciferol (VITAMIN D) 1000 UNITS tablet Take 1,000 Units by mouth daily.    [provider]  cyclobenzaprine (FLEXERIL) 10 MG tablet Take 1 tablet (10 mg total) by mouth 3 (three) times daily as needed for muscle spasms. 01/01/17   Newman Pies, MD  esomeprazole (NEXIUM) 40 MG capsule Take 40 mg by mouth 2 (two) times daily before a meal.    [provider]  fluticasone (FLONASE) 50 MCG/ACT nasal spray Place 2 sprays into both nostrils 2 (two) times daily.     [provider]  gabapentin (NEURONTIN) 300 MG capsule Take 300 mg by mouth 3 (three) times daily.  06/07/14   [provider]  hydrochlorothiazide (HYDRODIURIL) 25 MG tablet Take 12.5 mg by mouth daily.  05/31/14   [provider]  HYDROmorphone (DILAUDID) 4 MG tablet Take 4 mg by mouth every 4 (four) hours as needed for severe pain.    [provider]  HYDROmorphone (DILAUDID) 4 MG tablet Take 1 tablet (4 mg total) by mouth every 4 (four) hours as needed for severe pain. 01/01/17   Newman Pies, MD  loratadine (CLARITIN) 10 MG tablet Take 10 mg by mouth daily as needed for allergies.     [provider]  Multiple Vitamin (MULTIVITAMIN WITH MINERALS) TABS  tablet Take 1 tablet by mouth daily.    [provider]  polyethylene glycol powder (GLYCOLAX/MIRALAX) powder Take 17 g by mouth daily.    [provider]  temazepam (RESTORIL) 15 MG capsule Take 15 mg by mouth at bedtime as needed for sleep.     [provider]  traMADol (ULTRAM) 50 MG tablet Take 25-50 mg by mouth 3 (three) times daily as needed for severe pain.     [provider]    Family History  Problem Relation Age of Onset   Breast cancer Neg Hx      Social History   Tobacco Use   Smoking status: Every Day    Packs/day: 1.00    Years: 52.00    Pack years: 52.00    Types: Cigarettes   Smokeless tobacco: Never  Substance Use Topics   Alcohol use: Yes    Comment: occasionally   Drug use: No    Allergies as of 03/21/2021 - Review Complete 12/31/2016  Allergen Reaction Noted   Simvastatin Other (See Comments) 01/23/2015   Codeine Other (See Comments) 08/14/2014    Review of Systems:    All systems reviewed and negative except where noted in HPI.   Physical Exam:  There were no vitals taken for this visit. No LMP recorded. Patient is postmenopausal. General:   Alert,  Well-developed, well-nourished, pleasant and cooperative in NAD Head:  Normocephalic and atraumatic. Eyes:  Sclera clear, no icterus.   Conjunctiva pink. Ears:  Normal auditory acuity. Neck:  Supple; no masses or thyromegaly. Lungs:  Respirations even and unlabored.  Clear throughout to auscultation.   No wheezes, crackles, or rhonchi. No acute distress. Heart:  Regular rate and rhythm; no murmurs, clicks, rubs, or gallops. Abdomen:  Normal bowel sounds.  No bruits.  Soft, non-tender and non-distended without masses, hepatosplenomegaly or hernias noted.  No guarding or rebound tenderness.  Negative Carnett sign.   Rectal:  Deferred.  Pulses:  Normal pulses noted. Extremities:  No clubbing or edema.  No cyanosis. Neurologic:  Alert and oriented x3;  grossly normal  neurologically. Skin:  Intact without significant lesions or rashes.  No jaundice. Lymph Nodes:  No significant cervical adenopathy. Psych:  Alert and cooperative. Normal mood and affect.  Imaging Studies: No results found.  Assessment and Plan:   RUQAIYAH LABA is a 80 y.o. y/o female who comes today with a history of colon polyps and abdominal discomfort.  The patient's symptoms were related to her food but due to her being on Nexium for some time she will be switched to Protonix to see if that helps her symptoms.  The patient has been told that if her abdominal pain does not get better that when she comes  for her colonoscopy due to her history of polyps she can tell me that time if the medication is not working for her and that that time we may decide to proceed with an upper endoscopy at the same time.  The patient has been explained the plan and agrees with it.    Lucilla Lame, MD. Marval Regal    Note: This dictation was prepared with Dragon dictation along with smaller phrase technology. Any transcriptional errors that result from this process are unintentional.

## 2021-04-02 ENCOUNTER — Telehealth: Payer: Self-pay | Admitting: Gastroenterology

## 2021-04-02 ENCOUNTER — Encounter: Payer: Self-pay | Admitting: Gastroenterology

## 2021-04-02 NOTE — Telephone Encounter (Signed)
LVM for pt to return my call.

## 2021-04-02 NOTE — Telephone Encounter (Signed)
pantoprazole (PROTONIX) 40 MG tablet.  Once a day is not working can she take 2 a day, please advise.

## 2021-04-03 NOTE — Telephone Encounter (Signed)
LVM again for pt to return my call.  

## 2021-04-03 NOTE — Telephone Encounter (Signed)
Error

## 2021-04-03 NOTE — Telephone Encounter (Deleted)
Pt stated that after taking the Pantopr

## 2021-04-03 NOTE — Telephone Encounter (Signed)
Pt stated after taking the Pantoprazole, she starts experiencing symptoms within 4 hours. Pt is requesting to take this twice daily. Please advise.

## 2021-04-04 ENCOUNTER — Other Ambulatory Visit: Payer: Self-pay

## 2021-04-04 NOTE — Telephone Encounter (Signed)
Pt notified ok per Dr. Allen Norris to increase Pantoprazole '40mg'$  to twice daily.

## 2021-04-09 ENCOUNTER — Other Ambulatory Visit: Payer: Self-pay

## 2021-04-09 ENCOUNTER — Ambulatory Visit
Admission: RE | Admit: 2021-04-09 | Discharge: 2021-04-09 | Disposition: A | Discharge status: Home / Self Care | Payer: Medicare PPO | Attending: Gastroenterology | Admitting: Gastroenterology

## 2021-04-09 ENCOUNTER — Ambulatory Visit: Payer: Medicare PPO | Admitting: Registered Nurse

## 2021-04-09 ENCOUNTER — Ambulatory Visit: Admission: RE | Admit: 2021-04-09 | Payer: Medicare PPO | Source: Home / Self Care | Admitting: Internal Medicine

## 2021-04-09 ENCOUNTER — Encounter
Admission: RE | Disposition: A | Discharge status: Home / Self Care | Payer: Self-pay | Source: Home / Self Care | Attending: Gastroenterology

## 2021-04-09 ENCOUNTER — Encounter: Admission: RE | Payer: Self-pay | Source: Home / Self Care

## 2021-04-09 ENCOUNTER — Encounter: Payer: Self-pay | Admitting: Gastroenterology

## 2021-04-09 DIAGNOSIS — K2950 Unspecified chronic gastritis without bleeding: Secondary | ICD-10-CM | POA: Diagnosis not present

## 2021-04-09 DIAGNOSIS — Z79899 Other long term (current) drug therapy: Secondary | ICD-10-CM | POA: Diagnosis not present

## 2021-04-09 DIAGNOSIS — Z885 Allergy status to narcotic agent status: Secondary | ICD-10-CM | POA: Diagnosis not present

## 2021-04-09 DIAGNOSIS — Z86010 Personal history of colonic polyps: Secondary | ICD-10-CM

## 2021-04-09 DIAGNOSIS — Z888 Allergy status to other drugs, medicaments and biological substances status: Secondary | ICD-10-CM | POA: Diagnosis not present

## 2021-04-09 DIAGNOSIS — Z1211 Encounter for screening for malignant neoplasm of colon: Secondary | ICD-10-CM | POA: Diagnosis not present

## 2021-04-09 DIAGNOSIS — K219 Gastro-esophageal reflux disease without esophagitis: Secondary | ICD-10-CM | POA: Insufficient documentation

## 2021-04-09 DIAGNOSIS — D122 Benign neoplasm of ascending colon: Secondary | ICD-10-CM | POA: Insufficient documentation

## 2021-04-09 DIAGNOSIS — R12 Heartburn: Secondary | ICD-10-CM | POA: Insufficient documentation

## 2021-04-09 DIAGNOSIS — F17210 Nicotine dependence, cigarettes, uncomplicated: Secondary | ICD-10-CM | POA: Insufficient documentation

## 2021-04-09 DIAGNOSIS — K5730 Diverticulosis of large intestine without perforation or abscess without bleeding: Secondary | ICD-10-CM | POA: Insufficient documentation

## 2021-04-09 DIAGNOSIS — D124 Benign neoplasm of descending colon: Secondary | ICD-10-CM | POA: Insufficient documentation

## 2021-04-09 DIAGNOSIS — K31A11 Gastric intestinal metaplasia without dysplasia, involving the antrum: Secondary | ICD-10-CM | POA: Insufficient documentation

## 2021-04-09 HISTORY — PX: ESOPHAGOGASTRODUODENOSCOPY: SHX5428

## 2021-04-09 HISTORY — PX: COLONOSCOPY WITH PROPOFOL: SHX5780

## 2021-04-09 SURGERY — COLONOSCOPY WITH PROPOFOL
Anesthesia: General

## 2021-04-09 SURGERY — EGD (ESOPHAGOGASTRODUODENOSCOPY)
Anesthesia: General

## 2021-04-09 MED ORDER — SODIUM CHLORIDE 0.9 % IV SOLN: INTRAVENOUS | Status: DC

## 2021-04-09 MED ORDER — PROPOFOL 500 MG/50ML IV EMUL
INTRAVENOUS | Status: DC | PRN
  Administered 2021-04-09: 120 ug/kg/min via INTRAVENOUS

## 2021-04-09 MED ORDER — LIDOCAINE HCL (CARDIAC) PF 100 MG/5ML IV SOSY
PREFILLED_SYRINGE | INTRAVENOUS | Status: DC | PRN
  Administered 2021-04-09: 60 mg via INTRAVENOUS

## 2021-04-09 MED ORDER — PROPOFOL 10 MG/ML IV BOLUS
INTRAVENOUS | Status: DC | PRN
  Administered 2021-04-09: 10 mg via INTRAVENOUS
  Administered 2021-04-09: 70 mg via INTRAVENOUS

## 2021-04-09 MED ORDER — GLYCOPYRROLATE 0.2 MG/ML IJ SOLN
INTRAMUSCULAR | Status: DC | PRN
  Administered 2021-04-09: .2 mg via INTRAVENOUS

## 2021-04-09 NOTE — Transfer of Care (Signed)
Immediate Anesthesia Transfer of Care Note  Patient: Patricia Reid  Procedure(s) Performed: COLONOSCOPY WITH PROPOFOL ESOPHAGOGASTRODUODENOSCOPY (EGD)  Patient Location: PACU  Anesthesia Type:General  Level of Consciousness: sedated  Airway & Oxygen Therapy: Patient Spontanous Breathing and Patient connected to nasal cannula oxygen  Post-op Assessment: Report given to RN and Post -op Vital signs reviewed and stable  Post vital signs: Reviewed and stable  Last Vitals:  Vitals Value Taken Time  BP 88/64 04/09/21 0801  Temp    Pulse 95 04/09/21 0801  Resp 17 04/09/21 0801  SpO2 98 % 04/09/21 0801  Vitals shown include unvalidated device data.  Last Pain:  Vitals:   04/09/21 0801  TempSrc:   PainSc: Asleep         Complications: No notable events documented.

## 2021-04-09 NOTE — Anesthesia Preprocedure Evaluation (Signed)
Anesthesia Evaluation  Patient identified by MRN, date of birth, ID band Patient awake    Reviewed: Allergy & Precautions, H&P , NPO status , Patient's Chart, lab work & pertinent test results, reviewed documented beta blocker date and time   Airway Mallampati: II   Neck ROM: full    Dental  (+) Poor Dentition   Pulmonary COPD, Current Smoker and Patient abstained from smoking.,    Pulmonary exam normal        Cardiovascular Exercise Tolerance: Good hypertension, On Medications (-) angina+ CAD  (-) Cardiac Stents Normal cardiovascular exam(-) dysrhythmias  Rhythm:regular Rate:Normal     Neuro/Psych negative neurological ROS  negative psych ROS   GI/Hepatic Neg liver ROS, GERD  Medicated,  Endo/Other  negative endocrine ROS  Renal/GU negative Renal ROS  negative genitourinary   Musculoskeletal   Abdominal   Peds  Hematology negative hematology ROS (+)   Anesthesia Other Findings Past Medical History: No date: Arthritis No date: COPD (chronic obstructive pulmonary disease) ([place])     Comment:  slim to none No date: GERD (gastroesophageal reflux disease) No date: Hypertension Past Surgical History: 2008, 2016: BACK SURGERY No date: CHOLECYSTECTOMY No date: COLONOSCOPY 01/24/2015: COLONOSCOPY WITH PROPOFOL; N/A     Comment:  Procedure: COLONOSCOPY WITH PROPOFOL;  Surgeon: Manya Silvas, MD;  Location: [facility] ENDOSCOPY;  Service:               Endoscopy;  Laterality: N/A; 01/24/2015: ESOPHAGOGASTRODUODENOSCOPY; N/A     Comment:  Procedure: ESOPHAGOGASTRODUODENOSCOPY (EGD);  Surgeon:               Manya Silvas, MD;  Location: [facility] ENDOSCOPY;                Service: Endoscopy;  Laterality: N/A; 01/24/2015: SAVORY DILATION; N/A     Comment:  Procedure: SAVORY DILATION;  Surgeon: Manya Silvas,               MD;  Location: [facility] ENDOSCOPY;  Service: Endoscopy;                Laterality: N/A; No  date: TONSILLECTOMY   Reproductive/Obstetrics negative OB ROS                             Anesthesia Physical Anesthesia Plan  ASA: 3  Anesthesia Plan: General   Post-op Pain Management:    Induction:   PONV Risk Score and Plan:   Airway Management Planned:   Additional Equipment:   Intra-op Plan:   Post-operative Plan:   Informed Consent: I have reviewed the patients History and Physical, chart, labs and discussed the procedure including the risks, benefits and alternatives for the proposed anesthesia with the patient or authorized representative who has indicated his/her understanding and acceptance.     Dental Advisory Given  Plan Discussed with: CRNA  Anesthesia Plan Comments:         Anesthesia Quick Evaluation

## 2021-04-09 NOTE — Op Note (Signed)
[facility] Gastroenterology Patient Name: Patricia Reid Procedure Date: 04/09/2021 7:20 AM MRN: [id number] Account #: [id number] Date of Birth: 17-Feb-1941 Admit Type: Outpatient Age: 80 Room: [facility] ENDO ROOM 4 Gender: Female Note Status: Finalized Procedure:             Colonoscopy Indications:           High risk colon cancer surveillance: Personal history                         of adenomatous colonic polyps in 2016 Providers:             Lucilla Lame MD, MD Referring MD:          Ramonita Lab, MD (Referring MD) Medicines:             Propofol per Anesthesia Complications:         No immediate complications. Procedure:             Pre-Anesthesia Assessment:                        - Prior to the procedure, a History and Physical was                         performed, and patient medications and allergies were                         reviewed. The patient's tolerance of previous                         anesthesia was also reviewed. The risks and benefits                         of the procedure and the sedation options and risks                         were discussed with the patient. All questions were                         answered, and informed consent was obtained. Prior                         Anticoagulants: The patient has taken no previous                         anticoagulant or antiplatelet agents. ASA Grade                         Assessment: II - A patient with mild systemic disease.                         After reviewing the risks and benefits, the patient                         was deemed in satisfactory condition to undergo the                         procedure.  After obtaining informed consent, the colonoscope was                         passed under direct vision. Throughout the procedure,                         the patient's blood pressure, pulse, and oxygen                         saturations were monitored continuously.  The                         Colonoscope was introduced through the anus and                         advanced to the the cecum, identified by appendiceal                         orifice and ileocecal valve. The colonoscopy was                         performed without difficulty. The patient tolerated                         the procedure well. The quality of the bowel                         preparation was excellent. Findings:      The perianal and digital rectal examinations were normal.      A 3 mm polyp was found in the ascending colon. The polyp was sessile.       The polyp was removed with a cold biopsy forceps. Resection and       retrieval were complete.      A 3 mm polyp was found in the descending colon. The polyp was sessile.       The polyp was removed with a cold biopsy forceps. Resection and       retrieval were complete.      A few small-mouthed diverticula were found in the sigmoid colon. Impression:            - One 3 mm polyp in the ascending colon, removed with                         a cold biopsy forceps. Resected and retrieved.                        - One 3 mm polyp in the descending colon, removed with                         a cold biopsy forceps. Resected and retrieved.                        - Diverticulosis in the sigmoid colon. Recommendation:        - Discharge patient to home.                        - Resume previous diet.                        -  Continue present medications.                        - Await pathology results.                        - Repeat colonoscopy is not recommended for                         surveillance. Procedure Code(s):     --- Professional ---                        [id number], Colonoscopy, flexible; with biopsy, single or                         multiple Diagnosis Code(s):     --- Professional ---                        Z86.010, Personal history of colonic polyps                        K63.5, Polyp of colon CPT copyright 2019  American Medical Association. All rights reserved. The codes documented in this report are preliminary and upon coder review may  be revised to meet current compliance requirements. Lucilla Lame MD, MD 04/09/2021 8:01:42 AM This report has been signed electronically. Number of Addenda: 0 Note Initiated On: 04/09/2021 7:20 AM Scope Withdrawal Time: 0 hours 8 minutes 52 seconds  Total Procedure Duration: 0 hours 12 minutes 28 seconds  Estimated Blood Loss:  Estimated blood loss: none.      [facility]

## 2021-04-09 NOTE — Anesthesia Postprocedure Evaluation (Signed)
Anesthesia Post Note  Patient: Patricia Reid  Procedure(s) Performed: COLONOSCOPY WITH PROPOFOL ESOPHAGOGASTRODUODENOSCOPY (EGD)  Patient location during evaluation: PACU Anesthesia Type: General Level of consciousness: awake and alert Pain management: pain level controlled Vital Signs Assessment: post-procedure vital signs reviewed and stable Respiratory status: spontaneous breathing, nonlabored ventilation, respiratory function stable and patient connected to nasal cannula oxygen Cardiovascular status: blood pressure returned to baseline and stable Postop Assessment: no apparent nausea or vomiting Anesthetic complications: no   No notable events documented.   Last Vitals:  Vitals:   04/09/21 0811 04/09/21 0831  BP: 111/70 (!) 155/78  Pulse:    Resp: 19   Temp:    SpO2:      Last Pain:  Vitals:   04/09/21 0831  TempSrc:   PainSc: 0-No pain                 Molli Barrows

## 2021-04-09 NOTE — Interval H&P Note (Signed)
Lucilla Lame, MD [address] [place], [place] [address] Phone:[phone] Fax : [phone]  Primary Care Physician:  Adin Hector, MD Primary Gastroenterologist:  Dr. Allen Norris  Pre-Procedure History & Physical: HPI:  Patricia Reid is a 80 y.o. female is here for an endoscopy and colonoscopy.   Past Medical History:  Diagnosis Date   Arthritis    COPD (chronic obstructive pulmonary disease) ([place])    slim to none   GERD (gastroesophageal reflux disease)    Hypertension     Past Surgical History:  Procedure Laterality Date   BACK SURGERY  2008, 2016   CHOLECYSTECTOMY     COLONOSCOPY     COLONOSCOPY WITH PROPOFOL N/A 01/24/2015   Procedure: COLONOSCOPY WITH PROPOFOL;  Surgeon: Manya Silvas, MD;  Location: [place];  Service: Endoscopy;  Laterality: N/A;   ESOPHAGOGASTRODUODENOSCOPY N/A 01/24/2015   Procedure: ESOPHAGOGASTRODUODENOSCOPY (EGD);  Surgeon: Manya Silvas, MD;  Location: [facility] ENDOSCOPY;  Service: Endoscopy;  Laterality: N/A;   SAVORY DILATION N/A 01/24/2015   Procedure: SAVORY DILATION;  Surgeon: Manya Silvas, MD;  Location: [facility] ENDOSCOPY;  Service: Endoscopy;  Laterality: N/A;   TONSILLECTOMY      Prior to Admission medications   Medication Sig Start Date End Date Taking? Authorizing Provider  azelastine (ASTELIN) 0.1 % nasal spray Place 1 spray into both nostrils 2 (two) times daily. 07/18/14  Yes [provider]  cholecalciferol (VITAMIN D) 1000 UNITS tablet Take 1,000 Units by mouth daily.   Yes [provider]  cyclobenzaprine (FLEXERIL) 10 MG tablet Take 1 tablet (10 mg total) by mouth 3 (three) times daily as needed for muscle spasms. 01/01/17  Yes Newman Pies, MD  fluticasone [facility]) 50 MCG/ACT nasal spray Place 2 sprays into both nostrils 2 (two) times daily.    Yes [provider]  gabapentin (NEURONTIN) 300 MG capsule Take 300 mg by mouth 3 (three) times daily.  06/07/14  Yes [provider]  hydrochlorothiazide (HYDRODIURIL) 25 MG tablet Take 12.5 mg by mouth daily.  05/31/14  Yes [provider]  loratadine (CLARITIN) 10 MG tablet Take 10 mg by mouth daily as needed for allergies.    Yes [provider]  Multiple Vitamin (MULTIVITAMIN WITH MINERALS) TABS tablet Take 1 tablet by mouth daily.   Yes [provider]  pantoprazole (PROTONIX) 40 MG tablet Take 1 tablet (40 mg total) by mouth daily. 03/21/21  Yes Lucilla Lame, MD  temazepam (RESTORIL) 15 MG capsule Take 15 mg by mouth at bedtime as needed for sleep.    Yes [provider]  traMADol (ULTRAM) 50 MG tablet Take 25-50 mg by mouth 3 (three) times daily as needed for severe pain.    Yes [provider]  HYDROmorphone (DILAUDID) 4 MG tablet Take 4 mg by mouth every 4 (four) hours as needed for severe pain.    [provider]  HYDROmorphone (DILAUDID) 4 MG tablet Take 1 tablet (4 mg total) by mouth every 4 (four) hours as needed for severe pain. 01/01/17   Newman Pies, MD  polyethylene glycol powder (GLYCOLAX/MIRALAX) powder Take 17 g by mouth daily.    [provider]  Sod Picosulfate-Mag Ox-Cit Acd (CLENPIQ) 10-3.5-12 MG-GM -GM/160ML SOLN Take 320 mLs by mouth as directed. 03/21/21   Lucilla Lame, MD    Allergies as of 03/21/2021 - Review Complete 03/21/2021  Allergen Reaction Noted   Simvastatin Other (See Comments) 01/23/2015   Alendronate  05/14/2017  Codeine Other (See Comments) 08/14/2014    Family History  Problem Relation Age of Onset   Breast cancer Neg Hx     Social History   Socioeconomic History   Marital status: Single    Spouse name: Not on file   Number of children: Not on file   Years of education: Not on file   Highest education level: Not on file  Occupational History   Not on file  Tobacco Use   Smoking status: Every Day    Packs/day: 1.00    Years: 52.00    Pack years: 52.00    Types: Cigarettes   Smokeless  tobacco: Never  Vaping Use   Vaping Use: Never used  Substance and Sexual Activity   Alcohol use: Yes    Comment: occasionally   Drug use: No   Sexual activity: Not on file  Other Topics Concern   Not on file  Social History Narrative   Not on file   Social Determinants of Health   Financial Resource Strain: Not on file  Food Insecurity: Not on file  Transportation Needs: Not on file  Physical Activity: Not on file  Stress: Not on file  Social Connections: Not on file  Intimate Partner Violence: Not on file    Review of Systems: See HPI, otherwise negative ROS  Physical Exam: BP (!) 143/79   Pulse 100   Temp (!) 97.5 F (36.4 C) (Temporal)   Resp 16   Ht '5\' 5"'$  (1.651 m)   Wt 61.2 kg   SpO2 100%   BMI 22.47 kg/m  General:   Alert,  pleasant and cooperative in NAD Head:  Normocephalic and atraumatic. Neck:  Supple; no masses or thyromegaly. Lungs:  Clear throughout to auscultation.    Heart:  Regular rate and rhythm. Abdomen:  Soft, nontender and nondistended. Normal bowel sounds, without guarding, and without rebound.   Neurologic:  Alert and  oriented x4;  grossly normal neurologically.  Impression/Plan: Patricia Reid is here for an endoscopy and colonoscopy to be performed for GERD and history of colon polyps  Risks, benefits, limitations, and alternatives regarding  endoscopy and colonoscopy have been reviewed with the patient.  Questions have been answered.  All parties agreeable.   Lucilla Lame, MD  04/09/2021, 7:34 AM

## 2021-04-09 NOTE — Op Note (Signed)
[facility] Gastroenterology Patient Name: Patricia Reid Procedure Date: 04/09/2021 7:20 AM MRN: [id number] Account #: [id number] Date of Birth: 1941-04-30 Admit Type: Outpatient Age: 80 Room: 21 [facility] ENDO ROOM 4 Gender: Female Note Status: Finalized Procedure:             Upper GI endoscopy Indications:           Heartburn Providers:             Lucilla Lame MD, MD Referring MD:          Ramonita Lab, MD (Referring MD) Medicines:             Propofol per Anesthesia Complications:         No immediate complications. Procedure:             Pre-Anesthesia Assessment:                        - Prior to the procedure, a History and Physical was                         performed, and patient medications and allergies were                         reviewed. The patient's tolerance of previous                         anesthesia was also reviewed. The risks and benefits                         of the procedure and the sedation options and risks                         were discussed with the patient. All questions were                         answered, and informed consent was obtained. Prior                         Anticoagulants: The patient has taken no previous                         anticoagulant or antiplatelet agents. ASA Grade                         Assessment: II - A patient with mild systemic disease.                         After reviewing the risks and benefits, the patient                         was deemed in satisfactory condition to undergo the                         procedure.                        After obtaining informed consent, the endoscope was  passed under direct vision. Throughout the procedure,                         the patient's blood pressure, pulse, and oxygen                         saturations were monitored continuously. The Endoscope                         was introduced through the mouth, and advanced to the                          second part of duodenum. The upper GI endoscopy was                         accomplished without difficulty. The patient tolerated                         the procedure well. Findings:      The examined esophagus was normal.      Localized mild inflammation characterized by erythema was found in the       gastric antrum. Biopsies were taken with a cold forceps for histology.      The examined duodenum was normal. Impression:            - Normal esophagus.                        - Gastritis. Biopsied.                        - Normal examined duodenum. Recommendation:        - Discharge patient to home.                        - Resume previous diet.                        - Continue present medications.                        - Await pathology results.                        - Perform a colonoscopy today. Procedure Code(s):     --- Professional ---                        [phone], Esophagogastroduodenoscopy, flexible,                         transoral; with biopsy, single or multiple Diagnosis Code(s):     --- Professional ---                        R12, Heartburn                        K29.70, Gastritis, unspecified, without bleeding CPT copyright 2019 American Medical Association. All rights reserved. The codes documented in this report are preliminary and upon coder review may  be revised to meet current compliance requirements. Lucilla Lame MD, MD 04/09/2021 7:45:14 AM This  report has been signed electronically. Number of Addenda: 0 Note Initiated On: 04/09/2021 7:20 AM Estimated Blood Loss:  Estimated blood loss: none.      [facility]

## 2021-04-10 ENCOUNTER — Encounter: Payer: Self-pay | Admitting: Gastroenterology

## 2021-04-10 LAB — SURGICAL PATHOLOGY

## 2021-04-17 ENCOUNTER — Telehealth: Payer: Self-pay

## 2021-04-17 NOTE — Telephone Encounter (Signed)
Pt scheduled for a follow up tomorrow. 04/18/21.

## 2021-04-17 NOTE — Telephone Encounter (Signed)
-----   Message from Lucilla Lame, MD sent at 04/14/2021 11:35 AM EDT ----- Please have the patient come in for a follow up.

## 2021-04-18 ENCOUNTER — Other Ambulatory Visit: Payer: Self-pay

## 2021-04-18 ENCOUNTER — Encounter: Payer: Self-pay | Admitting: Gastroenterology

## 2021-04-18 ENCOUNTER — Ambulatory Visit (INDEPENDENT_AMBULATORY_CARE_PROVIDER_SITE_OTHER): Payer: Medicare PPO | Admitting: Gastroenterology

## 2021-04-18 VITALS — BP 150/77 | HR 106 | Temp 97.6°F | Ht 65.5 in | Wt 134.1 lb

## 2021-04-18 DIAGNOSIS — K31A0 Gastric intestinal metaplasia, unspecified: Secondary | ICD-10-CM

## 2021-04-18 NOTE — Progress Notes (Signed)
Primary Care Physician: Adin Hector, MD  Primary Gastroenterologist:  Dr. Lucilla Lame  Chief Complaint  Patient presents with   discuss path results     HPI: Patricia Reid is a 80 y.o. female here for follow-up after an EGD and colonoscopy.  At the time of the EGD there was biopsies of gastritis seen that showed:  DIAGNOSIS:  A. STOMACH, ANTRUM; COLD BIOPSY:  - GASTRIC ANTRAL MUCOSA DISPLAYING CHRONIC GASTRITIS WITH FOCAL  ACTIVITY, REACTIVE FOVEOLAR HYPERPLASIA, AND INTESTINAL METAPLASIA.  - NEGATIVE FOR H. PYLORI, DYSPLASIA, AND MALIGNANCY.  The patient was brought back to the office today to review the pathology and discuss the implications of gastric intestinal metaplasia.  The patient also had 2 small polyps in the colon that were found to be an adenoma and a sessile serrated polyp.  Past Medical History:  Diagnosis Date   Arthritis    COPD (chronic obstructive pulmonary disease) ([place])    slim to none   GERD (gastroesophageal reflux disease)    Hypertension     Current Outpatient Medications  Medication Sig Dispense Refill   azelastine (ASTELIN) 0.1 % nasal spray Place 1 spray into both nostrils 2 (two) times daily.  5   cholecalciferol (VITAMIN D) 1000 UNITS tablet Take 1,000 Units by mouth daily.     cyclobenzaprine (FLEXERIL) 10 MG tablet Take 1 tablet (10 mg total) by mouth 3 (three) times daily as needed for muscle spasms. 50 tablet 1   fluticasone (FLONASE) 50 MCG/ACT nasal spray Place 2 sprays into both nostrils 2 (two) times daily.      gabapentin (NEURONTIN) 300 MG capsule Take 300 mg by mouth 3 (three) times daily.   11   hydrochlorothiazide (HYDRODIURIL) 25 MG tablet Take 12.5 mg by mouth daily.   11   HYDROmorphone (DILAUDID) 4 MG tablet Take 1 tablet (4 mg total) by mouth every 4 (four) hours as needed for severe pain. 40 tablet 0   loratadine (CLARITIN) 10 MG tablet Take 10 mg by mouth daily as needed for allergies.      Multiple Vitamin  (MULTIVITAMIN WITH MINERALS) TABS tablet Take 1 tablet by mouth daily.     pantoprazole (PROTONIX) 40 MG tablet Take 1 tablet (40 mg total) by mouth daily. 30 tablet 6   polyethylene glycol powder (GLYCOLAX/MIRALAX) powder Take 17 g by mouth daily.     Sod Picosulfate-Mag Ox-Cit Acd (CLENPIQ) 10-3.5-12 MG-GM -GM/160ML SOLN Take 320 mLs by mouth as directed. 320 mL 0   telmisartan (MICARDIS) 20 MG tablet Take 20 mg by mouth daily.     temazepam (RESTORIL) 15 MG capsule Take 15 mg by mouth at bedtime as needed for sleep.      traMADol (ULTRAM) 50 MG tablet Take 25-50 mg by mouth 3 (three) times daily as needed for severe pain.      No current facility-administered medications for this visit.    Allergies as of 04/18/2021 - Review Complete 04/18/2021  Allergen Reaction Noted   Simvastatin Other (See Comments) 01/23/2015   Alendronate  05/14/2017   Codeine Other (See Comments) 08/14/2014    ROS:  General: Negative for anorexia, weight loss, fever, chills, fatigue, weakness. ENT: Negative for hoarseness, difficulty swallowing , nasal congestion. CV: Negative for chest pain, angina, palpitations, dyspnea on exertion, peripheral edema.  Respiratory: Negative for dyspnea at rest, dyspnea on exertion, cough, sputum, wheezing.  GI: See history of present illness. GU:  Negative for dysuria, hematuria, urinary incontinence, urinary frequency,  nocturnal urination.  Endo: Negative for unusual weight change.    Physical Examination:   BP (!) 150/77 (BP Location: Left Arm, Patient Position: Sitting, Cuff Size: Normal)   Pulse (!) 106   Temp 97.6 F (36.4 C) (Oral)   Ht 5' 5.5" (1.664 m)   Wt 134 lb 2 oz (60.8 kg)   BMI 21.98 kg/m   General: Well-nourished, well-developed in no acute distress.  Eyes: No icterus. Conjunctivae pink. Skin: Warm and dry, no jaundice.  Ulcer on her left leg from a recent injury Psych: Alert and cooperative, normal mood and affect.  Labs:    Imaging  Studies: No results found.  Assessment and Plan:   MARILOU Reid is a 80 y.o. y/o female who comes in for follow-up of her pathology.  The patient has been told that despite having 2 polyps in the colon she does not need a repeat colonoscopy at her age.  The patient was also told about the gastric intestinal metaplasia and she also reports that she continues to have some burning sensation in her epigastric area she is not sure if the Nexium was better than the pantoprazole.  She has been told that she should keep an eye on it and if it does not get better we can go back to the Nexium.  She also states that sometimes eating food makes her symptoms better and I have given her the option of taking Tums as an alternative to eating to see if that helps.  The patient has been explained the plan and agrees with it.     Lucilla Lame, MD. Marval Regal    Note: This dictation was prepared with Dragon dictation along with smaller phrase technology. Any transcriptional errors that result from this process are unintentional.

## 2021-06-14 ENCOUNTER — Other Ambulatory Visit: Payer: Self-pay

## 2021-06-14 MED ORDER — NEXIUM 40 MG PO CPDR: 40.0000 mg | DELAYED_RELEASE_CAPSULE | Freq: Every day | ORAL | 6 refills | Status: AC

## 2021-06-19 ENCOUNTER — Other Ambulatory Visit: Payer: Self-pay | Admitting: General Surgery

## 2021-06-19 DIAGNOSIS — R928 Other abnormal and inconclusive findings on diagnostic imaging of breast: Secondary | ICD-10-CM

## 2021-07-23 ENCOUNTER — Ambulatory Visit
Admission: RE | Admit: 2021-07-23 | Discharge: 2021-07-23 | Disposition: A | Discharge status: Home / Self Care | Payer: Medicare PPO | Source: Ambulatory Visit | Attending: General Surgery | Admitting: General Surgery

## 2021-07-23 ENCOUNTER — Other Ambulatory Visit: Payer: Medicare PPO

## 2021-07-23 ENCOUNTER — Ambulatory Visit: Payer: Medicare PPO

## 2021-07-23 ENCOUNTER — Other Ambulatory Visit: Payer: Self-pay

## 2021-07-23 DIAGNOSIS — R928 Other abnormal and inconclusive findings on diagnostic imaging of breast: Secondary | ICD-10-CM

## 2021-10-02 ENCOUNTER — Ambulatory Visit: Payer: Medicare PPO | Attending: Neurosurgery

## 2021-10-02 DIAGNOSIS — M542 Cervicalgia: Secondary | ICD-10-CM | POA: Diagnosis not present

## 2021-10-02 DIAGNOSIS — G8929 Other chronic pain: Secondary | ICD-10-CM | POA: Diagnosis present

## 2021-10-02 DIAGNOSIS — M25511 Pain in right shoulder: Secondary | ICD-10-CM | POA: Diagnosis present

## 2021-10-02 NOTE — Therapy (Signed)
[facility] [address], [place], [address] Phone: [phone]   Fax:  [phone]  Physical Therapy Evaluation  Patient Details  Name: Patricia Reid MRN: [id number] Date of Birth: 07-01-1941 Referring Provider (PT): Newman Pies MD   Encounter Date: 10/02/2021   PT End of Session - 10/02/21 1505     Visit Number 1    Number of Visits 16    Date for PT Re-Evaluation 11/27/21    Authorization Type [employer]    Authorization Time Period 10/02/21-11/27/21    Progress Note Due on Visit 10    PT Start Time 1420    PT Stop Time 1500    PT Time Calculation (min) 40 min    Activity Tolerance Patient tolerated treatment well;No increased pain             Past Medical History:  Diagnosis Date   Arthritis    COPD (chronic obstructive pulmonary disease) ([place])    slim to none   GERD (gastroesophageal reflux disease)    Hypertension     Past Surgical History:  Procedure Laterality Date   BACK SURGERY  2008, 2016   CHOLECYSTECTOMY     COLONOSCOPY     COLONOSCOPY WITH PROPOFOL N/A 01/24/2015   Procedure: COLONOSCOPY WITH PROPOFOL;  Surgeon: Manya Silvas, MD;  Location: [facility];  Service: Endoscopy;  Laterality: N/A;   COLONOSCOPY WITH PROPOFOL N/A 04/09/2021   Procedure: COLONOSCOPY WITH PROPOFOL;  Surgeon: Lucilla Lame, MD;  Location: [facility] ENDOSCOPY;  Service: Endoscopy;  Laterality: N/A;   ESOPHAGOGASTRODUODENOSCOPY N/A 01/24/2015   Procedure: ESOPHAGOGASTRODUODENOSCOPY (EGD);  Surgeon: Manya Silvas, MD;  Location: [facility] ENDOSCOPY;  Service: Endoscopy;  Laterality: N/A;   ESOPHAGOGASTRODUODENOSCOPY N/A 04/09/2021   Procedure: ESOPHAGOGASTRODUODENOSCOPY (EGD);  Surgeon: Lucilla Lame, MD;  Location: [facility] ENDOSCOPY;  Service: Endoscopy;  Laterality: N/A;   SAVORY DILATION N/A 01/24/2015   Procedure: SAVORY DILATION;  Surgeon: Manya Silvas, MD;  Location[facility] ENDOSCOPY;  Service:  Endoscopy;  Laterality: N/A;   TONSILLECTOMY      There were no vitals filed for this visit.    Subjective Assessment - 10/02/21 1424     Subjective [name] is here for persistent scapular pain on right despite cervical surgical intervention.    Pertinent History Patricia Reid is a 48 YOF referred to outpatient physical therapy by neurosurgical consult Newman Pies, MD for ongoing cervicalgia.  Prior surgical history significant for lumbar spine fusion and cervical discectomy.  Patient has expressed disinterest in future surgical interventions for her neck pain with.  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  C3-5 ACDF undergone March 2022. Pt Seen by Dr. Sharlet Salina in December for acute on chronic low back pain with leg radiation, and acute on chronic right periscapular pain consistent with cervical muscle spasm. Pt has intermitent achy pain near the upper trap/supraspinatus area on right. Pt reports 6/10 pain at worst.    Limitations Other (comment)   pt unable to tolerating Rt side sleeping   How long can you stand comfortably? not related    How long can you walk comfortably? no related    Currently in Pain? Yes    Pain Score 4    Rt periscapular area near supraspinatus   Pain Location --   Right periscapular  pain   Aggravating Factors  pt is unaware    Pain Relieving Factors pt is unaware    Effect of Pain on Daily Activities Pt does not attempt lying on right side                [facility] PT Assessment - 10/02/21 0001       Assessment   Medical Diagnosis Cervicalgia   Rt periscapular pain, chornic   Referring Provider (PT) Newman Pies MD    Onset Date/Surgical Date --   March 2022   Hand Dominance Right    Prior Therapy --   for neck years back presurgical     Precautions   Precautions None       Balance Screen   Has the patient fallen in the past 6 months No    Has the patient had a decrease in activity level because of a fear of falling?  No    Is the patient reluctant to leave their home because of a fear of falling?  No      Observation/Other Assessments   Focus on Therapeutic Outcomes (FOTO)  61             Right cervical rotation: 55 degrees, with 15 seconds of sustained endrange patient reports decreased symptomatic pain and increased numbness in the arm ulnar distribution of the forearm Left cervical rotation: 45 degrees, no change in symptoms with 15 degrees of sustained endrange   Cervical extension 35 Gy, no change in symptoms with sustained positioning  Left shoulder flexion 4+/5 Right shoulder flexion 4/5, no pain or symptoms  Left shoulder PROM IR 66, ER 105 Right shoulder PROM IR 65, ER 105 Left shoulder MMT: Right 4+/5, left 4+/5 Right shoulder MMT: IR and ER 5/5  Deep palpation right upper trapezius: Unrelated tenderness Deep palpation right supraspinatus: Unrelated tenderness Deep palpation the right levator Scap: Tenderness very similar to chief complaint  Sustained release to right levator Scap x3 minutes, reduced tenderness with each repetition.  *Deferred due to time this visit Spurling's compression test, neural tension test of the upper quarter, cervical strength       Objective measurements completed on examination: See above findings.       PT Education - 10/02/21 1505     Education Details role of future targeted therapy to Rt levator scapulae    Person(s) Educated Patient    Methods Explanation    Comprehension Verbalized understanding              PT Short Term Goals - 10/02/21 1554       PT SHORT TERM GOAL #1   Title Patient to demonstrate adherence and correct performance of HEP with positive affect on symptoms management.    Time 4    Period Weeks    Status New    Target Date 10/30/21                PT Long Term Goals - 10/02/21 1555       PT LONG TERM GOAL #1   Title Patient with patient to demonstrate improved cervical rotation ROM greater than 10 degrees bilateral.    Time 8    Period Weeks    Status New    Target Date 11/27/21      PT LONG TERM GOAL #2   Title Patient to demonstrate improved F OTO score greater than 10 points to indicate improved perception of the ease and performance of basic mobility for ADL and  IADL    Time 8    Period Weeks    Status New    Target Date 11/27/21      PT LONG TERM GOAL #3   Title Patient to report decreased and average pain to 3 out of 10 or less decrease in worst pain to 5/10 or less    Baseline 6 at worst 3-4 at average    Time 8    Period Weeks    Status New    Target Date 11/27/21                    Plan - 10/02/21 1549     Clinical Impression Statement Patient presenting to outpatient physical therapy for ongoing periscapular pain for more than 1 year with no relief after cervical discectomy.  Examination reveals anticipated range of motion limitations in cervical spine without aggravation of symptoms.  Patient has unrelated discomfort with palpation of upper trapezius on right and supraspinatus on right.  Patient has no frank weakness or asymmetry in glenohumeral rotation or strength of rotators bilaterally.  Within the confines of time of this session patient demonstrates closest resembling aggravation of symptoms with deep palpation of the levator Scap on the right side, with improved tenderness after sustained release.  Further diagnostics to follow at future visits.  Patient will continue to benefit from skilled PT intervention to maximize range of motion strength and postural tolerance to restore to prior level of function and ADL and IADL performance.    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    PT Home Exercise Plan none    Consulted and Agree with Plan of Care Patient             Patient will benefit from skilled therapeutic intervention in order to improve the following deficits and impairments:  Decreased activity tolerance, Decreased range of motion, Decreased strength, Increased fascial restricitons, Increased muscle spasms, Hypermobility  Visit Diagnosis: Cervicalgia  Chronic right shoulder pain     Problem List Patient Active Problem List   Diagnosis Date Noted   COPD (chronic obstructive pulmonary disease) ([place]) 03/21/2021   GERD (gastroesophageal reflux disease) 03/21/2021   Hyperlipidemia 03/21/2021   Hypertension 03/21/2021   Aortic atherosclerosis ([place]) 06/09/2017   Coronary artery calcification seen on CT scan 06/09/2017   Lumbar stenosis with neurogenic claudication 12/31/2016   Personal history of tobacco use, presenting hazards to health 05/24/2016   Myalgia 04/16/2016   Trochanteric bursitis of left hip 05/28/2015   DDD (degenerative disc disease), cervical 03/05/2015   Spondylolisthesis of lumbar region 08/21/2014   4:03 PM, 10/02/21 Etta Grandchild, PT, DPT Physical Therapist - [place] [phone] (Office)   [place] C, PT 10/02/2021, 3:58 PM  [facility] [address], [place], [address] Phone: [phone]    Fax:  [phone]  Name: SHANELL ADEN MRN: [id number] Date of Birth: 1941/01/13

## 2021-10-07 ENCOUNTER — Ambulatory Visit: Payer: Medicare PPO | Admitting: Physical Therapy

## 2021-10-07 ENCOUNTER — Other Ambulatory Visit: Payer: Self-pay

## 2021-10-07 DIAGNOSIS — M542 Cervicalgia: Secondary | ICD-10-CM | POA: Diagnosis not present

## 2021-10-07 DIAGNOSIS — G8929 Other chronic pain: Secondary | ICD-10-CM

## 2021-10-07 NOTE — Therapy (Signed)
[facility] [address] [place], [address] Phone: [phone]   Fax:  [phone]  Physical Therapy Treatment  Patient Details  Name: Patricia Reid MRN: [id number] Date of Birth: 1941/06/03 Referring Provider (Patricia Reid): Newman Pies MD   Encounter Date: 10/07/2021   Patricia Reid End of Session - 10/07/21 1552     Visit Number 2    Number of Visits 16    Date for Patricia Reid Re-Evaluation 11/27/21    Authorization Type [employer]    Authorization Time Period 10/02/21-11/27/21    Progress Note Due on Visit 10    Patricia Reid Start Time 1545    Patricia Reid Stop Time 1630    Patricia Reid Time Calculation (min) 45 min    Activity Tolerance Patient tolerated treatment well;No increased pain             Past Medical History:  Diagnosis Date   Arthritis    COPD (chronic obstructive pulmonary disease) ([place])    slim to none   GERD (gastroesophageal reflux disease)    Hypertension     Past Surgical History:  Procedure Laterality Date   BACK SURGERY  2008, 2016   CHOLECYSTECTOMY     COLONOSCOPY     COLONOSCOPY WITH PROPOFOL N/A 01/24/2015   Procedure: COLONOSCOPY WITH PROPOFOL;  Surgeon: Manya Silvas, MD;  Location: [facility];  Service: Endoscopy;  Laterality: N/A;   COLONOSCOPY WITH PROPOFOL N/A 04/09/2021   Procedure: COLONOSCOPY WITH PROPOFOL;  Surgeon: Lucilla Lame, MD;  Location: [facility] ENDOSCOPY;  Service: Endoscopy;  Laterality: N/A;   ESOPHAGOGASTRODUODENOSCOPY N/A 01/24/2015   Procedure: ESOPHAGOGASTRODUODENOSCOPY (EGD);  Surgeon: Manya Silvas, MD;  Location: [facility] ENDOSCOPY;  Service: Endoscopy;  Laterality: N/A;   ESOPHAGOGASTRODUODENOSCOPY N/A 04/09/2021   Procedure: ESOPHAGOGASTRODUODENOSCOPY (EGD);  Surgeon: Lucilla Lame, MD;  Location: [facility] ENDOSCOPY;  Service: Endoscopy;  Laterality: N/A;   SAVORY DILATION N/A 01/24/2015   Procedure: SAVORY DILATION;  Surgeon: Manya Silvas, MD;  Location: [facility] ENDOSCOPY;  Service:  Endoscopy;  Laterality: N/A;   TONSILLECTOMY      There were no vitals filed for this visit.   Subjective Assessment - 10/07/21 1549     Subjective Patricia Reid reports having increased R shoulder pain upon arrival to the clinic today.  Patricia Reid noting 3/10 pain in the R shoulder.    Pertinent History Patricia Reid is a 10 YOF referred to outpatient physical therapy by neurosurgical consult Newman Pies, MD for ongoing cervicalgia.  Prior surgical history significant for lumbar spine fusion and cervical discectomy.  Patient has expressed disinterest in future surgical interventions for her neck pain with.  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  C3-5 ACDF undergone March 2022. Patricia Reid Seen by Dr. Sharlet Salina in December for acute on chronic low back pain with leg radiation, and acute on chronic right periscapular pain consistent with cervical muscle spasm. Patricia Reid has intermitent achy pain near the upper trap/supraspinatus area on right. Patricia Reid reports 6/10 pain at worst.    Limitations Other (comment)   Patricia Reid unable to tolerating Rt side sleeping   How long can you stand comfortably? not related    How long can you walk comfortably? no related    Currently in Pain? Yes    Pain Score 3     Pain Location Shoulder  Pain Orientation Right    Pain Descriptors / Indicators Aching                   Interventions this date:  Manual Therapy  Supine STM to cervical region to increase extensibility of the paraspinals Supine suboccipital release technique to decrease cervicalgia Supine manual traction performed in order to increase joint space in cervical region for pain relief Supine cervical upglides/downglides, 30 sec bouts Supine UT/Levator stretch, 30 sec bouts to increase tissue extensibility of the cervical region Seated STM with TP  release technique applied to the UT's for increased tissue extensibility          Patricia Reid Short Term Goals - 10/02/21 1554       Patricia Reid SHORT TERM GOAL #1   Title Patient to demonstrate adherence and correct performance of HEP with positive affect on symptoms management.    Time 4    Period Weeks    Status New    Target Date 10/30/21               Patricia Reid Long Term Goals - 10/02/21 1555       Patricia Reid LONG TERM GOAL #1   Title Patient with patient to demonstrate improved cervical rotation ROM greater than 10 degrees bilateral.    Time 8    Period Weeks    Status New    Target Date 11/27/21      Patricia Reid LONG TERM GOAL #2   Title Patient to demonstrate improved F OTO score greater than 10 points to indicate improved perception of the ease and performance of basic mobility for ADL and IADL    Time 8    Period Weeks    Status New    Target Date 11/27/21      Patricia Reid LONG TERM GOAL #3   Title Patient to report decreased and average pain to 3 out of 10 or less decrease in worst pain to 5/10 or less    Baseline 6 at worst 3-4 at average    Time 8    Period Weeks    Status New    Target Date 11/27/21                   Plan - 10/07/21 1918     Clinical Impression Statement Patricia Reid participated in [place] therapy and responded well to the approaches offered by the therapist.  Patricia Reid noted a reduction in her pain levels upon leaving the clinic with increased ROM of the cervical spine and shoulder musculature after session.  Patricia Reid will continue to benefit from manual therapy to reduced pain levels to the threshold where Patricia Reid is able to tolerate increased strengthening exercises.    Personal Factors and Comorbidities Age;Past/Current Experience;Comorbidity 1    Examination-Activity Limitations Sleep;Carry;Lift    Examination-Participation Restrictions Laundry;Cleaning;Yard Work    Stability/Clinical Decision Making Stable/Uncomplicated    Rehab Potential Fair    Patricia Reid Frequency 2x / week    Patricia Reid Duration 8  weeks    Patricia Reid Treatment/Interventions ADLs/Self Care Home Management;Electrical Stimulation;Taping;Passive range of motion;Dry needling;Balance training;Therapeutic activities;Functional mobility training;Moist Heat;Patient/family education    Patricia Reid Next Visit Plan Return to levator trap soft tissue work, addiitonal diagnostics to R/O cervical radiculitis.    Patricia Reid Home Exercise Plan none    Consulted and Agree with Plan of Care Patient             Patient will benefit from skilled therapeutic intervention in order to improve the following deficits and  impairments:  Decreased activity tolerance, Decreased range of motion, Decreased strength, Increased fascial restricitons, Increased muscle spasms, Hypermobility  Visit Diagnosis: Cervicalgia  Chronic right shoulder pain     Problem List Patient Active Problem List   Diagnosis Date Noted   COPD (chronic obstructive pulmonary disease) ([place]) 03/21/2021   GERD (gastroesophageal reflux disease) 03/21/2021   Hyperlipidemia 03/21/2021   Hypertension 03/21/2021   Aortic atherosclerosis ([place]) 06/09/2017   Coronary artery calcification seen on CT scan 06/09/2017   Lumbar stenosis with neurogenic claudication 12/31/2016   Personal history of tobacco use, presenting hazards to health 05/24/2016   Myalgia 04/16/2016   Trochanteric bursitis of left hip 05/28/2015   DDD (degenerative disc disease), cervical 03/05/2015   Spondylolisthesis of lumbar region 08/21/2014    Patricia Reid, Patricia Reid, Patricia Reid 10/07/21, 7:29 PM   [facility] [address] [place], [address] Phone: [phone]   Fax:  [phone]  Name: Patricia Reid MRN: [id number] Date of Birth: 14-Oct-1940

## 2021-10-09 ENCOUNTER — Other Ambulatory Visit: Payer: Self-pay

## 2021-10-09 ENCOUNTER — Ambulatory Visit: Payer: Medicare PPO | Attending: Neurosurgery

## 2021-10-09 DIAGNOSIS — M542 Cervicalgia: Secondary | ICD-10-CM | POA: Diagnosis not present

## 2021-10-09 DIAGNOSIS — G8929 Other chronic pain: Secondary | ICD-10-CM | POA: Insufficient documentation

## 2021-10-09 DIAGNOSIS — M25511 Pain in right shoulder: Secondary | ICD-10-CM | POA: Diagnosis present

## 2021-10-09 NOTE — Therapy (Signed)
[facility] ?[address] ?[place], [place], [address] ?Phone: [phone]   Fax:  [phone] ? ?Physical Therapy Treatment ? ?Patient Details  ?Name: Patricia Reid ?MRN: [id number] ?Date of Birth: 1941-02-08 ?Referring Provider (PT): Newman Pies MD ? ? ?Encounter Date: 10/09/2021 ? ? PT End of Session - 10/09/21 1342   ? ? Visit Number 3   ? Number of Visits 16   ? Date for PT Re-Evaluation 11/27/21   ? Authorization Type [employer]   ? Authorization Time Period 10/02/21-11/27/21   ? Progress Note Due on Visit 10   ? PT Start Time 1332   ? PT Stop Time 1412   ? PT Time Calculation (min) 40 min   ? Activity Tolerance Patient tolerated treatment well;No increased pain   ? Behavior During Therapy [facility] for tasks assessed/performed   ? ?  ?  ? ?  ? ? ?Past Medical History:  ?Diagnosis Date  ? Arthritis   ? COPD (chronic obstructive pulmonary disease) ([place])   ? slim to none  ? GERD (gastroesophageal reflux disease)   ? Hypertension   ? ? ?Past Surgical History:  ?Procedure Laterality Date  ? BACK SURGERY  2008, 2016  ? CHOLECYSTECTOMY    ? COLONOSCOPY    ? COLONOSCOPY WITH PROPOFOL N/A 01/24/2015  ? Procedure: COLONOSCOPY WITH PROPOFOL;  Surgeon: Manya Silvas, MD;  Location: [facility] ENDOSCOPY;  Service: Endoscopy;  Laterality: N/A;  ? COLONOSCOPY WITH PROPOFOL N/A 04/09/2021  ? Procedure: COLONOSCOPY WITH PROPOFOL;  Surgeon: Lucilla Lame, MD;  Location: [facility] ENDOSCOPY;  Service: Endoscopy;  Laterality: N/A;  ? ESOPHAGOGASTRODUODENOSCOPY N/A 01/24/2015  ? Procedure: ESOPHAGOGASTRODUODENOSCOPY (EGD);  Surgeon: Manya Silvas, MD;  Location: [facility] ENDOSCOPY;  Service: Endoscopy;  Laterality: N/A;  ? ESOPHAGOGASTRODUODENOSCOPY N/A 04/09/2021  ? Procedure: ESOPHAGOGASTRODUODENOSCOPY (EGD);  Surgeon: Lucilla Lame, MD;  Location: [facility] ENDOSCOPY;  Service: Endoscopy;  Laterality: N/A;  ? SAVORY DILATION N/A 01/24/2015  ? Procedure: SAVORY DILATION;  Surgeon:  Manya Silvas, MD;  Location: [facility] ENDOSCOPY;  Service: Endoscopy;  Laterality: N/A;  ? TONSILLECTOMY    ? ? ?There were no vitals filed for this visit. ? ? Subjective Assessment - 10/09/21 1336   ? ? Subjective Pt doing well today, some ST pain after last session, but she thinks it helped a lot and felt better yesterday.   ? Pertinent History Patricia Reid is a 41 YOF referred to outpatient physical therapy by neurosurgical consult Newman Pies, MD for ongoing cervicalgia.  Prior surgical history significant for lumbar spine fusion and cervical discectomy.  Patient has expressed disinterest in future surgical interventions for her neck pain with.  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  C3-5 ACDF undergone March 2022. Pt Seen by Dr. Sharlet Salina in December for acute on chronic low back pain with leg radiation, and acute on chronic right periscapular pain consistent with cervical muscle spasm. Pt has intermitent achy pain near the upper trap/supraspinatus area on right. Pt reports 6/10 pain at worst.   ? Currently in Pain? Yes   ? Pain Score 3    Rt periscapular area  ? ?  ?  ? ?  ? ?INTERVENTION THIS DATE: ?-AA/ROM 4 minutes, level 2, arms 11, seat 8 ? ?-Cervical A/ROM rotation 1x15 bilat ?-Cervical extension x15,  cues for full body synergy ? ?-Hooklying Cervical retraction into pillows 12x3secH ?-hooklying small head lift 1x10 ? ?-Hooklying Cervical retraction into pillows 12x3secH ?-hooklying small head lift 1x10 ? ?-wand flexion in hooklying with silver physio ball 1x10, 1x10 (break for muscle fatigue)  ? ?-MFR to Rt levator scap x4 minutes, ART with Rt arm flexion ? ?-stand cable row 15lb x15, cues for scapular retraction and thoracic extension at end range ?-standing shoulder lateral raises 1x15 @ 1lb (looks pretty easy effort,  consider increase next session) ?-standing shoulder flexion to 90 degrees 2lb FW 1x15 bilat  ? ?-stand cable row 15lb x15, cues for scapular retraction and thoracic extension at end range ?-standing shoulder lateral raises 1x15 @ 1lb (looks pretty easy effort, consider increase next session) ?-standing shoulder flexion to 90 degrees 2lb FW 1x15 bilat  ? ? ? ? ? PT Education - 10/09/21 1339   ? ? Education Details hep technique and form   ? Person(s) Educated Patient   ? Methods Explanation   ? Comprehension Verbalized understanding   ? ?  ?  ? ?  ? ? ? PT Short Term Goals - 10/02/21 1554   ? ?  ? PT SHORT TERM GOAL #1  ? Title Patient to demonstrate adherence and correct performance of HEP with positive affect on symptoms management.   ? Time 4   ? Period Weeks   ? Status New   ? Target Date 10/30/21   ? ?  ?  ? ?  ? ? ? ? PT Long Term Goals - 10/02/21 1555   ? ?  ? PT LONG TERM GOAL #1  ? Title Patient with patient to demonstrate improved cervical rotation ROM greater than 10 degrees bilateral.   ? Time 8   ? Period Weeks   ? Status New   ? Target Date 11/27/21   ?  ? PT LONG TERM GOAL #2  ? Title Patient to demonstrate improved F OTO score greater than 10 points to indicate improved perception of the ease and performance of basic mobility for ADL and IADL   ? Time 8   ? Period Weeks   ? Status New   ? Target Date 11/27/21   ?  ? PT LONG TERM GOAL #3  ? Title Patient to report decreased and average pain to 3 out of 10 or less decrease in worst pain to 5/10 or less   ? Baseline 6 at worst 3-4 at average   ? Time 8   ? Period Weeks   ? Status New   ? Target Date 11/27/21   ? ?  ?  ? ?  ? ? ? ? ? ? ? ? Plan - 10/09/21 1346   ? ? Clinical Impression Statement Advanced mobility work in scapulothoracic joints, strength in cervical region. Commenced shoulder strength program. Pt tolerates well, no pain in session, but does endorse efforts and DOE.   ? Personal Factors and Comorbidities Age;Past/Current  Experience;Comorbidity 1   ? Examination-Activity Limitations Sleep;Carry;Lift   ? Examination-Participation Restrictions Cleaning   ? Stability/Clinical Decision Making Stable/Uncomplicated   ? Clinical Decision Making High   ? Rehab Potential Fair   ? PT Frequency 2x / week   ? PT Duration 8 weeks   ? PT Treatment/Interventions ADLs/Self Care Home Management;Electrical Stimulation;Taping;Passive range of motion;Dry needling;Balance training;Therapeutic activities;Functional mobility training;Moist Heat;Patient/family education   ? PT Next Visit Plan Return to levator trap soft tissue work, addiitonal diagnostics to R/O cervical  radiculitis.   ? PT Home Exercise Plan none   ? Consulted and Agree with Plan of Care Patient   ? ?  ?  ? ?  ? ? ?Patient will benefit from skilled therapeutic intervention in order to improve the following deficits and impairments:  Decreased activity tolerance, Decreased range of motion, Decreased strength, Increased fascial restricitons, Increased muscle spasms, Hypermobility ? ?Visit Diagnosis: ?Cervicalgia ? ?Chronic right shoulder pain ? ? ? ? ?Problem List ?Patient Active Problem List  ? Diagnosis Date Noted  ? COPD (chronic obstructive pulmonary disease) ([place]) 03/21/2021  ? GERD (gastroesophageal reflux disease) 03/21/2021  ? Hyperlipidemia 03/21/2021  ? Hypertension 03/21/2021  ? Aortic atherosclerosis ([place]) 06/09/2017  ? Coronary artery calcification seen on CT scan 06/09/2017  ? Lumbar stenosis with neurogenic claudication 12/31/2016  ? Personal history of tobacco use, presenting hazards to health 05/24/2016  ? Myalgia 04/16/2016  ? Trochanteric bursitis of left hip 05/28/2015  ? DDD (degenerative disc disease), cervical 03/05/2015  ? Spondylolisthesis of lumbar region 08/21/2014  ? ?2:13 PM, 10/09/21 ?Etta Grandchild, PT, DPT ?Physical Therapist - [place] ?[phone] (Office) ? ? ? ?Shawna Kiener C, PT ?10/09/2021, 1:49 PM ? ? ?[facility] ?[address] ?[place], [place], [address] ?Phone: [phone]   Fax:  [phone] ? ?Name: Patricia Reid ?MRN: [id number] ?Date of Birth: [birth year] ? ? ? ?

## 2021-10-14 ENCOUNTER — Ambulatory Visit: Payer: Medicare PPO | Admitting: Physical Therapy

## 2021-10-14 ENCOUNTER — Encounter: Payer: Self-pay | Admitting: Physical Therapy

## 2021-10-14 ENCOUNTER — Other Ambulatory Visit: Payer: Self-pay

## 2021-10-14 DIAGNOSIS — G8929 Other chronic pain: Secondary | ICD-10-CM

## 2021-10-14 DIAGNOSIS — M25511 Pain in right shoulder: Secondary | ICD-10-CM

## 2021-10-14 DIAGNOSIS — M542 Cervicalgia: Secondary | ICD-10-CM

## 2021-10-14 NOTE — Therapy (Signed)
[place] [facility] ?[address] ?[place], [place], [address] ?Phone: [phone]   Fax:  [phone] ? ?Physical Therapy Treatment ? ?Patient Details  ?Name: Patricia Reid ?MRN: [id number] ?Date of Birth: 1941-07-25 ?Referring Provider (PT): Newman Pies MD ? ? ?Encounter Date: 10/14/2021 ? ? PT End of Session - 10/14/21 1505   ? ? Visit Number 4   ? Number of Visits 16   ? Date for PT Re-Evaluation 11/27/21   ? Authorization Type [employer]   ? Authorization Time Period 10/02/21-11/27/21   ? Progress Note Due on Visit 10   ? PT Start Time 1500   ? PT Stop Time 1324   ? PT Time Calculation (min) 45 min   ? Activity Tolerance Patient tolerated treatment well;No increased pain   ? Behavior During Therapy [facility] for tasks assessed/performed   ? ?  ?  ? ?  ? ? ?Past Medical History:  ?Diagnosis Date  ? Arthritis   ? COPD (chronic obstructive pulmonary disease) ([place])   ? slim to none  ? GERD (gastroesophageal reflux disease)   ? Hypertension   ? ? ?Past Surgical History:  ?Procedure Laterality Date  ? BACK SURGERY  2008, 2016  ? CHOLECYSTECTOMY    ? COLONOSCOPY    ? COLONOSCOPY WITH PROPOFOL N/A 01/24/2015  ? Procedure: COLONOSCOPY WITH PROPOFOL;  Surgeon: Manya Silvas, MD;  Location: [facility] ENDOSCOPY;  Service: Endoscopy;  Laterality: N/A;  ? COLONOSCOPY WITH PROPOFOL N/A 04/09/2021  ? Procedure: COLONOSCOPY WITH PROPOFOL;  Surgeon: Lucilla Lame, MD;  Location: [facility] ENDOSCOPY;  Service: Endoscopy;  Laterality: N/A;  ? ESOPHAGOGASTRODUODENOSCOPY N/A 01/24/2015  ? Procedure: ESOPHAGOGASTRODUODENOSCOPY (EGD);  Surgeon: Manya Silvas, MD;  Location: [facility] ENDOSCOPY;  Service: Endoscopy;  Laterality: N/A;  ? ESOPHAGOGASTRODUODENOSCOPY N/A 04/09/2021  ? Procedure: ESOPHAGOGASTRODUODENOSCOPY (EGD);  Surgeon: Lucilla Lame, MD;  Location: [facility] ENDOSCOPY;  Service: Endoscopy;  Laterality: N/A;  ? SAVORY DILATION N/A 01/24/2015  ? Procedure: SAVORY DILATION;  Surgeon:  Manya Silvas, MD;  Location: [facility] ENDOSCOPY;  Service: Endoscopy;  Laterality: N/A;  ? TONSILLECTOMY    ? ? ?There were no vitals filed for this visit. ? ? Subjective Assessment - 10/14/21 1503   ? ? Subjective Pt reports that she has felt less neck pain since starting physical therapy. She has continued to take tramadol to treat pain.   ? Pertinent History Patricia Reid is a 46 YOF referred to outpatient physical therapy by neurosurgical consult Newman Pies, MD for ongoing cervicalgia.  Prior surgical history significant for lumbar spine fusion and cervical discectomy.  Patient has expressed disinterest in future surgical interventions for her neck pain with.  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  C3-5 ACDF undergone March 2022. Pt Seen by Dr. Sharlet Salina in December for acute on chronic low back pain with leg radiation, and acute on chronic right periscapular pain consistent with cervical muscle spasm. Pt has intermitent achy pain near the upper trap/supraspinatus area on right. Pt reports 6/10 pain at worst.   ? Currently in Pain? Yes   ? Pain Score 2    ? Pain Location Shoulder   ? Pain Descriptors / Indicators Aching   ? Pain Type Chronic pain   ? ?  ?  ? ?  ? ? ?MANUAL THERAPY: ? ?  Suboccipital Release 3 x 30 sec ? ?Right Upper Trap Trigger Point Release  ? ?Theracane Right Trigger Point Release  ?-Pt reports numbness and tingling in her left pinky  ? ?Trigger Point Release with Estell Harpin   ? ?[place]: ? ?Chin Tucks 3 x 10  ?Upper Trap Stretch 3 x 60 sec  ? ? ?Updated HEP and educated patient on changes to exercises and addition of new exercises  ? ? ? ? ? ? ? ? PT Education - 10/14/21 1504   ? ? Education Details form and technique for appropriate exercise   ? Person(s) Educated Patient   ? Methods  Explanation;Demonstration;Verbal cues;Handout   ? Comprehension Verbalized understanding;Returned demonstration;Tactile cues required   ? ?  ?  ? ?  ? ? ? PT Short Term Goals - 10/14/21 1556   ? ?  ? PT SHORT TERM GOAL #1  ? Title Patient to demonstrate adherence and correct performance of HEP with positive affect on symptoms management.   ? Time 4   ? Period Weeks   ? Status On-going   ? Target Date 10/30/21   ? ?  ?  ? ?  ? ? ? ? PT Long Term Goals - 10/14/21 1557   ? ?  ? PT LONG TERM GOAL #1  ? Title Patient with patient to demonstrate improved cervical rotation ROM greater than 10 degrees bilateral.   ? Time 8   ? Period Weeks   ? Status On-going   ? Target Date 11/27/21   ?  ? PT LONG TERM GOAL #2  ? Title Patient to demonstrate improved FOTO score greater than 10 points to indicate improved perception of the ease and performance of basic mobility for ADL and IADL   ? Time 8   ? Period Weeks   ? Status On-going   ? Target Date 11/27/21   ?  ? PT LONG TERM GOAL #3  ? Title Patient to report decreased and average pain to 3 out of 10 or less decrease in worst pain to 5/10 or less   ? Baseline 6 at worst 3-4 at average   ? Time 8   ? Period Weeks   ? Status On-going   ? Target Date 11/27/21   ? ?  ?  ? ?  ? ? ? ? ? ? ? ? Plan - 10/14/21 1559   ? ? Clinical Impression Statement Pt continues to experience pain in upper right trap that is relieved with trigger point release. Pt able to successfully perform self trigger point release with use of lacrosse ball. Her n/t are localized to ulnar nerve as symptoms radiate from elbow to pinky. She will continue to benefit from skilled PT to strengthen parascapular musculature to decrease neck pain to sleep comfortably.   ? Personal Factors and Comorbidities Age;Past/Current Experience;Comorbidity 1   ? Examination-Activity Limitations Sleep;Carry;Lift   ? Examination-Participation Restrictions Cleaning   ? Stability/Clinical Decision Making Stable/Uncomplicated   ? Rehab  Potential Fair   ? PT Frequency 2x / week   ? PT Duration 8 weeks   ? PT Treatment/Interventions ADLs/Self Care Home Management;Electrical Stimulation;Taping;Passive range of motion;Dry needling;Balance training;Therapeutic activities;Functional mobility training;Moist Heat;Patient/family education   ? PT Next Visit Plan Return to levator trap soft tissue work, addiitonal diagnostics to R/O cervical radiculitis.   ? PT Home Exercise Plan [email or web address]   ? Consulted and Agree with Plan of Care Patient   ? ?  ?  ? ?  ? ?  HEP includes the following:  ? ?Access Code: [email or web address] ?URL: [email or web address] ?Date: 10/14/2021 ?Prepared by: Bradly Chris ? ?Exercises ?Seated Cervical Retraction - 1 x daily - 3 x weekly - 3 sets - 10 reps - 2 hold ?Seated Upper Trapezius Stretch - 1 x daily - 7 x weekly - 1 sets - 3 reps - 60 hold ? ? ?Patient will benefit from skilled therapeutic intervention in order to improve the following deficits and impairments:  Decreased activity tolerance, Decreased range of motion, Decreased strength, Increased fascial restricitons, Increased muscle spasms, Hypermobility ? ?Visit Diagnosis: ?Cervicalgia ? ?Chronic right shoulder pain ? ? ? ? ?Problem List ?Patient Active Problem List  ? Diagnosis Date Noted  ? COPD (chronic obstructive pulmonary disease) ([place]) 03/21/2021  ? GERD (gastroesophageal reflux disease) 03/21/2021  ? Hyperlipidemia 03/21/2021  ? Hypertension 03/21/2021  ? Aortic atherosclerosis ([place]) 06/09/2017  ? Coronary artery calcification seen on CT scan 06/09/2017  ? Lumbar stenosis with neurogenic claudication 12/31/2016  ? Personal history of tobacco use, presenting hazards to health 05/24/2016  ? Myalgia 04/16/2016  ? Trochanteric bursitis of left hip 05/28/2015  ? DDD (degenerative disc disease), cervical 03/05/2015  ? Spondylolisthesis of lumbar region 08/21/2014  ? ?Bradly Chris PT, DPT  ?10/14/2021, 4:12 PM ? ?[facility] ?[address] ?[place], [place], [address] ?Phone: [phone]   Fax:  [phone] ? ?Name: Patricia Reid ?MRN: [id number] ?Date of Birth: Nov 02, 1940 ? ? ? ?

## 2021-10-16 ENCOUNTER — Other Ambulatory Visit: Payer: Self-pay | Admitting: *Deleted

## 2021-10-16 DIAGNOSIS — F17210 Nicotine dependence, cigarettes, uncomplicated: Secondary | ICD-10-CM

## 2021-10-16 DIAGNOSIS — Z87891 Personal history of nicotine dependence: Secondary | ICD-10-CM

## 2021-10-17 ENCOUNTER — Other Ambulatory Visit: Payer: Self-pay

## 2021-10-17 ENCOUNTER — Ambulatory Visit: Payer: Medicare PPO | Admitting: Physical Therapy

## 2021-10-17 DIAGNOSIS — G8929 Other chronic pain: Secondary | ICD-10-CM

## 2021-10-17 DIAGNOSIS — M542 Cervicalgia: Secondary | ICD-10-CM | POA: Diagnosis not present

## 2021-10-17 NOTE — Therapy (Signed)
[facility] [address], [place], [address] Phone: [phone]   Fax:  [phone]  Physical Therapy Treatment  Patient Details  Name: Patricia Reid MRN: [id number] Date of Birth: [birth year] Referring Provider (PT): Newman Pies MD   Encounter Date: 10/17/2021   PT End of Session - 10/17/21 1506     Visit Number 5    Number of Visits 16    Date for PT Re-Evaluation 11/27/21    Authorization Type [employer] Medicare    Authorization Time Period 10/02/21-11/27/21    Progress Note Due on Visit 10    PT Start Time 1500    PT Stop Time 1545    PT Time Calculation (min) 45 min    Activity Tolerance Patient tolerated treatment well;No increased pain    Behavior During Therapy [facility] for tasks assessed/performed             Past Medical History:  Diagnosis Date   Arthritis    COPD (chronic obstructive pulmonary disease) ([place])    slim to none   GERD (gastroesophageal reflux disease)    Hypertension     Past Surgical History:  Procedure Laterality Date   BACK SURGERY  2008, 2016   CHOLECYSTECTOMY     COLONOSCOPY     COLONOSCOPY WITH PROPOFOL N/A 01/24/2015   Procedure: COLONOSCOPY WITH PROPOFOL;  Surgeon: Manya Silvas, MD;  Location: [facility] [place];  Service: Endoscopy;  Laterality: N/A;   COLONOSCOPY WITH PROPOFOL N/A 04/09/2021   Procedure: COLONOSCOPY WITH PROPOFOL;  Surgeon: Lucilla Lame, MD;  Location: [facility] ENDOSCOPY;  Service: Endoscopy;  Laterality: N/A;   ESOPHAGOGASTRODUODENOSCOPY N/A 01/24/2015   Procedure: ESOPHAGOGASTRODUODENOSCOPY (EGD);  Surgeon: Manya Silvas, MD;  Location: [facility] ENDOSCOPY;  Service: Endoscopy;  Laterality: N/A;   ESOPHAGOGASTRODUODENOSCOPY N/A 04/09/2021   Procedure: ESOPHAGOGASTRODUODENOSCOPY (EGD);  Surgeon: Lucilla Lame, MD;  Location: [facility] ENDOSCOPY;  Service: Endoscopy;  Laterality: N/A;   SAVORY DILATION N/A 01/24/2015   Procedure: SAVORY DILATION;  Surgeon:  Manya Silvas, MD;  Location: [facility] ENDOSCOPY;  Service: Endoscopy;  Laterality: N/A;   TONSILLECTOMY      There were no vitals filed for this visit.   Subjective Assessment - 10/17/21 1504     Subjective Pt reports that her exercises are going well and that she has been feeling less neck pain as of late. She has not had to take pain meds at night for the past couple days.    Pertinent History Patricia Reid is a 40 YOF referred to outpatient physical therapy by neurosurgical consult Newman Pies, MD for ongoing cervicalgia.  Prior surgical history significant for lumbar spine fusion and cervical discectomy.  Patient has expressed disinterest in future surgical interventions for her neck pain with.  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  C3-5 ACDF undergone March 2022. Pt Seen by Dr. Sharlet Salina in December for acute on chronic low back pain with leg radiation, and acute on chronic right periscapular pain consistent with cervical muscle spasm. Pt has intermitent achy pain near the upper trap/supraspinatus area on right. Pt reports 6/10 pain at worst.    Currently in Pain? Yes    Pain Score 2     Pain Location Shoulder    Pain Orientation Right    Pain Descriptors / Indicators Aching  Pain Type Chronic pain    Pain Onset More than a month ago    Pain Frequency Intermittent    Aggravating Factors  Unaware    Pain Relieving Factors Unaware            THEREX:   UBE 8 min (4 min forward, 4 min backward) Seat level 7   Shoulder Rows use Red TB 3 x 10    Shoulder T's with YTB 1 x 10  -Pt unable to consistently extend elbow   Shoulder T's with #1 DB 2 x 10   Seated Piriformis stretch for pt reported pain in right glute 2 x 30 sec  - min VC to increase trunk flexion toward knee for increased stretch                           PT Education - 10/17/21 1506     Education Details form and technique for appropriate exercise    Person(s) Educated Patient    Methods Explanation;Demonstration;Handout;Verbal cues    Comprehension Verbalized understanding;Returned demonstration;Verbal cues required;Tactile cues required              PT Short Term Goals - 10/17/21 1508       PT SHORT TERM GOAL #1   Title Patient to demonstrate adherence and correct performance of HEP with positive affect on symptoms management.    Time 4    Period Weeks    Status On-going    Target Date 10/30/21               PT Long Term Goals - 10/17/21 1508       PT LONG TERM GOAL #1   Title Patient with patient to demonstrate improved cervical rotation ROM greater than 10 degrees bilateral.    Time 8    Period Weeks    Status On-going    Target Date 11/27/21      PT LONG TERM GOAL #2   Title Patient to demonstrate improved FOTO score greater than 10 points to indicate improved perception of the ease and performance of basic mobility for ADL and IADL    Time 8    Period Weeks    Status On-going    Target Date 11/27/21      PT LONG TERM GOAL #3   Title Patient to report decreased and average pain to 3 out of 10 or less decrease in worst pain to 5/10 or less    Baseline 6 at worst 3-4 at average    Time 8    Period Weeks    Status On-going    Target Date 11/27/21                   Plan - 10/17/21 1507     Clinical Impression Statement Pt shows progression with parascapular strength with ability to perform should rows and T's with increased resistance. Soft tissue massage yields litte trigger points along right medial upper scapula and trap especially compared to last session. She will continue to benefit from skilled PT to strengthen parascapular musculature to decrease neck pain to sleep comfortably.    Personal Factors and Comorbidities Age;Past/Current  Experience;Comorbidity 1    Examination-Activity Limitations Sleep;Carry;Lift    Examination-Participation Restrictions Cleaning    Stability/Clinical Decision Making Stable/Uncomplicated    Clinical Decision Making Low    Rehab Potential Fair    PT Frequency 2x / week    PT Duration 8 weeks  PT Treatment/Interventions ADLs/Self Care Home Management;Electrical Stimulation;Taping;Passive range of motion;Dry needling;Balance training;Therapeutic activities;Functional mobility training;Moist Heat;Patient/family education    PT Next Visit Plan Progress parascapular exercises.    PT Home Exercise Plan [email or web address]    Consulted and Agree with Plan of Care Patient            HEP includes the following:  Access Code: [email or web address] URL: [email or web address] Date: 10/17/2021 Prepared by: Bradly Chris  Exercises Seated Cervical Retraction - 1 x daily - 3 x weekly - 3 sets - 10 reps - 2 hold Seated Upper Trapezius Stretch - 1 x daily - 7 x weekly - 1 sets - 3 reps - 60 hold Seated Shoulder Row with Anchored Resistance - 1 x daily - 3 x weekly - 3 sets - 10 reps Standing Shoulder Horizontal Abduction with Resistance - 1 x daily - 3 x weekly - 3 sets - 10 reps  Patient will benefit from skilled therapeutic intervention in order to improve the following deficits and impairments:  Decreased activity tolerance, Decreased range of motion, Decreased strength, Increased fascial restricitons, Increased muscle spasms, Hypermobility  Visit Diagnosis: Cervicalgia  Chronic right shoulder pain     Problem List Patient Active Problem List   Diagnosis Date Noted   COPD (chronic obstructive pulmonary disease) ([place]) 03/21/2021   GERD (gastroesophageal reflux disease) 03/21/2021   Hyperlipidemia 03/21/2021   Hypertension 03/21/2021   Aortic atherosclerosis ([place]) 06/09/2017   Coronary artery calcification seen on CT scan 06/09/2017   Lumbar stenosis with neurogenic claudication 12/31/2016    Personal history of tobacco use, presenting hazards to health 05/24/2016   Myalgia 04/16/2016   Trochanteric bursitis of left hip 05/28/2015   DDD (degenerative disc disease), cervical 03/05/2015   Spondylolisthesis of lumbar region 08/21/2014   Bradly Chris PT, DPT  10/17/2021, 3:56 PM  [facility] [address] [place], [address] Phone: [phone]   Fax:  [phone]  Name: Patricia Reid MRN: [id number] Date of Birth: 11/17/1940

## 2021-10-21 ENCOUNTER — Encounter: Payer: Medicare PPO | Admitting: Physical Therapy

## 2021-10-22 ENCOUNTER — Ambulatory Visit: Payer: Medicare PPO | Admitting: Physical Therapy

## 2021-10-22 ENCOUNTER — Other Ambulatory Visit: Payer: Self-pay

## 2021-10-22 DIAGNOSIS — M542 Cervicalgia: Secondary | ICD-10-CM | POA: Diagnosis not present

## 2021-10-22 DIAGNOSIS — G8929 Other chronic pain: Secondary | ICD-10-CM

## 2021-10-22 NOTE — Therapy (Signed)
[facility] ?[address] ?[place], [place], [address] ?Phone: [phone]   Fax:  [phone] ? ?Physical Therapy Treatment ? ?Patient Details  ?Name: Patricia Reid ?MRN: [id number] ?Date of Birth: [birth year] ?Referring Provider (PT): Patricia Pies MD ? ? ?Encounter Date: 10/22/2021 ? ? PT End of Session - 10/22/21 1427   ? ? Visit Number 6   ? Number of Visits 16   ? Date for PT Re-Evaluation 11/27/21   ? Authorization Type [employer]   ? Authorization Time Period 10/02/21-11/27/21   ? Progress Note Due on Visit 10   ? PT Start Time 1420   ? PT Stop Time 1500   ? PT Time Calculation (min) 40 min   ? Activity Tolerance Patient tolerated treatment well;No increased pain   ? Behavior During Therapy [facility] for tasks assessed/performed   ? ?  ?  ? ?  ? ? ?Past Medical History:  ?Diagnosis Date  ? Arthritis   ? COPD (chronic obstructive pulmonary disease) ([place])   ? slim to none  ? GERD (gastroesophageal reflux disease)   ? Hypertension   ? ? ?Past Surgical History:  ?Procedure Laterality Date  ? BACK SURGERY  2008, 2016  ? CHOLECYSTECTOMY    ? COLONOSCOPY    ? COLONOSCOPY WITH PROPOFOL N/A 01/24/2015  ? Procedure: COLONOSCOPY WITH PROPOFOL;  Surgeon: Manya Silvas, MD;  Location: [facility] ENDOSCOPY;  Service: Endoscopy;  Laterality: N/A;  ? COLONOSCOPY WITH PROPOFOL N/A 04/09/2021  ? Procedure: COLONOSCOPY WITH PROPOFOL;  Surgeon: Lucilla Lame, MD;  Location: [facility] ENDOSCOPY;  Service: Endoscopy;  Laterality: N/A;  ? ESOPHAGOGASTRODUODENOSCOPY N/A 01/24/2015  ? Procedure: ESOPHAGOGASTRODUODENOSCOPY (EGD);  Surgeon: Manya Silvas, MD;  Location: [facility] ENDOSCOPY;  Service: Endoscopy;  Laterality: N/A;  ? ESOPHAGOGASTRODUODENOSCOPY N/A 04/09/2021  ? Procedure: ESOPHAGOGASTRODUODENOSCOPY (EGD);  Surgeon: Lucilla Lame, MD;  Location: [facility] ENDOSCOPY;  Service: Endoscopy;  Laterality: N/A;  ? SAVORY DILATION N/A 01/24/2015  ? Procedure: SAVORY DILATION;  Surgeon:  Manya Silvas, MD;  Location: [facility] ENDOSCOPY;  Service: Endoscopy;  Laterality: N/A;  ? TONSILLECTOMY    ? ? ?There were no vitals filed for this visit. ? ? Subjective Assessment - 10/22/21 1424   ? ? Subjective Pt reports that she is doing well with exercises and she has some shoulder tenderness but trigger points have improved   ? Pertinent History Patricia Reid is a 46 YOF referred to outpatient physical therapy by neurosurgical consult Patricia Pies, MD for ongoing cervicalgia.  Prior surgical history significant for lumbar spine fusion and cervical discectomy.  Patient has expressed disinterest in future surgical interventions for her neck pain with.  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  C3-5 ACDF undergone March 2022. Pt Seen by Dr. Sharlet Salina in December for acute on chronic low back pain with leg radiation, and acute on chronic right periscapular pain consistent with cervical muscle spasm. Pt has intermitent achy pain near the upper trap/supraspinatus area on right. Pt reports 6/10 pain at worst.   ? Currently in Pain? Yes   ? Pain Score 2    ? Pain Location Shoulder   ? Pain Orientation Right   ? Pain Descriptors / Indicators Aching   ? Pain Type Chronic pain   ? Pain Onset More than a month ago   ?  Pain Relieving Factors Takes tramadol before appointments   ? ?  ?  ? ?  ? ?MANUAL THERAPY: ? ?Left Glute Med Trigger Point Release  ?Self- Glute MedTrigger Point Release with lacrosse ball ? ?[place]:  ? ?Figure 4 stretch 2 x 60 sec  ? ?Supine HS Stretch with Strap 2 x 60 sec  ? ?[place] Pose 2 x 60 sec  ?-Pt unable to achieve pose  ? ?Sky Punches 3 x 10  ?-min VC to maintain elbow extension  ? ? ? ? ? ? ? ? ? ? ? ? ? ? ? ? ? ? ? ? ? ? ? ? ? PT Education - 10/22/21 1426   ? ? Education Details form and technique for appropriate  exercise   ? Person(s) Educated Patient   ? Methods Explanation;Demonstration;Verbal cues;Handout   ? Comprehension Verbalized understanding;Returned demonstration;Verbal cues required   ? ?  ?  ? ?  ? ? ? PT Short Term Goals - 10/22/21 1442   ? ?  ? PT SHORT TERM GOAL #1  ? Title Patient to demonstrate adherence and correct performance of HEP with positive affect on symptoms management.   ? Time 4   ? Period Weeks   ? Status On-going   ? Target Date 10/30/21   ? ?  ?  ? ?  ? ? ? ? PT Long Term Goals - 10/22/21 1442   ? ?  ? PT LONG TERM GOAL #1  ? Title Patient with patient to demonstrate improved cervical rotation ROM greater than 10 degrees bilateral.   ? Time 8   ? Period Weeks   ? Status On-going   ? Target Date 11/27/21   ?  ? PT LONG TERM GOAL #2  ? Title Patient to demonstrate improved FOTO score greater than 10 points to indicate improved perception of the ease and performance of basic mobility for ADL and IADL   ? Time 8   ? Period Weeks   ? Status On-going   ? Target Date 11/27/21   ?  ? PT LONG TERM GOAL #3  ? Title Patient to report decreased and average pain to 3 out of 10 or less decrease in worst pain to 5/10 or less   ? Baseline 6 at worst 3-4 at average   ? Time 8   ? Period Weeks   ? Status On-going   ? Target Date 11/27/21   ? ?  ?  ? ?  ? ? ? ? ? ? ? ? Plan - 10/22/21 1441   ? ? Clinical Impression Statement Pt's session largely focused on recent left hip pain that is limiting her ability to do standing exercises. Left hip pain resolved with glute med trigger point release. She continues to exhibit improvement with resolution of trigger points along medial right upper scapula, but she continues to experience pain with activity that has been managed with pain medication. She exhibits difficulty with coordinating limbs to demonstrate exercise and requires mod VC and TC. Next session to focus more on treatment of RUE.   ? Personal Factors and Comorbidities Age;Past/Current Experience;Comorbidity 1    ? Examination-Activity Limitations Sleep;Carry;Lift   ? Examination-Participation Restrictions Cleaning   ? Stability/Clinical Decision Making Stable/Uncomplicated   ? Clinical Decision Making Low   ? Rehab Potential Fair   ? PT Frequency 2x / week   ? PT Duration 8 weeks   ? PT Treatment/Interventions ADLs/Self Care Home Management;Electrical Stimulation;Taping;Passive range of motion;Dry needling;Balance  training;Therapeutic activities;Functional mobility training;Moist Heat;Patient/family education   ? PT Next Visit Plan Progress parascapular exercise and Continue with soft tissue work of upper trap and parascapular musculature   ? PT Home Exercise Plan [email or web address]   ? Consulted and Agree with Plan of Care Patient   ? ?  ?  ? ?  ? ?HEP includes the following: ? ?Access Code: [email or web address] ?URL: [email or web address] ?Date: 10/22/2021 ?Prepared by: Bradly Chris ? ?Exercises ?Seated Cervical Retraction - 1 x daily - 3 x weekly - 3 sets - 10 reps - 2 hold ?Seated Upper Trapezius Stretch - 1 x daily - 7 x weekly - 1 sets - 3 reps - 60 hold ?Seated Shoulder Row with Anchored Resistance - 1 x daily - 3 x weekly - 3 sets - 10 reps ?Standing Shoulder Horizontal Abduction with Resistance - 1 x daily - 3 x weekly - 3 sets - 10 reps ?Supine Scapular Protraction in Flexion with Dumbbells - 1 x daily - 3 x weekly - 3 sets - 10 reps ? ? ?Patient will benefit from skilled therapeutic intervention in order to improve the following deficits and impairments:  Decreased activity tolerance, Decreased range of motion, Decreased strength, Increased fascial restricitons, Increased muscle spasms, Hypermobility ? ?Visit Diagnosis: ?Cervicalgia ? ?Chronic right shoulder pain ? ? ? ? ?Problem List ?Patient Active Problem List  ? Diagnosis Date Noted  ? COPD (chronic obstructive pulmonary disease) ([place]) 03/21/2021  ? GERD (gastroesophageal reflux disease) 03/21/2021  ? Hyperlipidemia 03/21/2021  ? Hypertension 03/21/2021  ? Aortic  atherosclerosis ([place]) 06/09/2017  ? Coronary artery calcification seen on CT scan 06/09/2017  ? Lumbar stenosis with neurogenic claudication 12/31/2016  ? Personal history of tobacco use, presenting hazard

## 2021-10-23 ENCOUNTER — Encounter: Payer: Medicare PPO | Admitting: Physical Therapy

## 2021-10-29 ENCOUNTER — Ambulatory Visit: Payer: Medicare PPO | Admitting: Physical Therapy

## 2021-10-29 ENCOUNTER — Other Ambulatory Visit: Payer: Self-pay

## 2021-10-29 ENCOUNTER — Encounter: Payer: Self-pay | Admitting: Physical Therapy

## 2021-10-29 DIAGNOSIS — M542 Cervicalgia: Secondary | ICD-10-CM | POA: Diagnosis not present

## 2021-10-29 DIAGNOSIS — G8929 Other chronic pain: Secondary | ICD-10-CM

## 2021-10-29 NOTE — Therapy (Signed)
?OUTPATIENT PHYSICAL THERAPY TREATMENT NOTE ? ? ?Patient Name: Patricia Reid ?MRN: [id number] ?DOB:October 19, 1940, 81 y.o., female ?Today's Date: 10/29/2021 ? ?PCP: Adin Hector, MD ?REFERRING PROVIDER: Newman Pies, MD ? ? PT End of Session - 10/29/21 1334   ? ? Visit Number 7   ? Number of Visits 16   ? Date for PT Re-Evaluation 11/27/21   ? Authorization Type [employer]   ? Authorization Time Period 10/02/21-11/27/21   ? Progress Note Due on Visit 10   ? PT Start Time 1330   ? PT Stop Time 1660   ? PT Time Calculation (min) 45 min   ? Activity Tolerance Patient tolerated treatment well;No increased pain   ? Behavior During Therapy [facility] for tasks assessed/performed   ? ?  ?  ? ?  ? ? ?Past Medical History:  ?Diagnosis Date  ? Arthritis   ? COPD (chronic obstructive pulmonary disease) ([place])   ? slim to none  ? GERD (gastroesophageal reflux disease)   ? Hypertension   ? ?Past Surgical History:  ?Procedure Laterality Date  ? BACK SURGERY  2008, 2016  ? CHOLECYSTECTOMY    ? COLONOSCOPY    ? COLONOSCOPY WITH PROPOFOL N/A 01/24/2015  ? Procedure: COLONOSCOPY WITH PROPOFOL;  Surgeon: Manya Silvas, MD;  Location: [facility] ENDOSCOPY;  Service: Endoscopy;  Laterality: N/A;  ? COLONOSCOPY WITH PROPOFOL N/A 04/09/2021  ? Procedure: COLONOSCOPY WITH PROPOFOL;  Surgeon: Lucilla Lame, MD;  Location: [facility] ENDOSCOPY;  Service: Endoscopy;  Laterality: N/A;  ? ESOPHAGOGASTRODUODENOSCOPY N/A 01/24/2015  ? Procedure: ESOPHAGOGASTRODUODENOSCOPY (EGD);  Surgeon: Manya Silvas, MD;  Location: [facility] ENDOSCOPY;  Service: Endoscopy;  Laterality: N/A;  ? ESOPHAGOGASTRODUODENOSCOPY N/A 04/09/2021  ? Procedure: ESOPHAGOGASTRODUODENOSCOPY (EGD);  Surgeon: Lucilla Lame, MD;  Location: [facility] ENDOSCOPY;  Service: Endoscopy;  Laterality: N/A;  ? SAVORY DILATION N/A 01/24/2015  ? Procedure: SAVORY DILATION;  Surgeon: Manya Silvas, MD;  Location: [facility] ENDOSCOPY;  Service: Endoscopy;  Laterality: N/A;  ? TONSILLECTOMY    ? ?Patient Active  Problem List  ? Diagnosis Date Noted  ? COPD (chronic obstructive pulmonary disease) ([place]) 03/21/2021  ? GERD (gastroesophageal reflux disease) 03/21/2021  ? Hyperlipidemia 03/21/2021  ? Hypertension 03/21/2021  ? Aortic atherosclerosis ([place]) 06/09/2017  ? Coronary artery calcification seen on CT scan 06/09/2017  ? Lumbar stenosis with neurogenic claudication 12/31/2016  ? Personal history of tobacco use, presenting hazards to health 05/24/2016  ? Myalgia 04/16/2016  ? Trochanteric bursitis of left hip 05/28/2015  ? DDD (degenerative disc disease), cervical 03/05/2015  ? Spondylolisthesis of lumbar region 08/21/2014  ? ? ?REFERRING DIAG: Cervicalgia and right shoulder pain  ? ?THERAPY DIAG:  ?Cervicalgia ? ?Chronic right shoulder pain ? ?PERTINENT HISTORY:  ? ? ?PRECAUTIONS: None  ? ?SUBJECTIVE: Pt reports that trigger point releases for her glute med have helped and that she uses them whenever she has pain. She continues to experience some right shoulder and neck pain.  ? ?PAIN:  ?Are you having pain? Yes: NPRS scale: 2/10 ?Pain location: right upper trap and right shoulder  ?Pain description: achy pain  ?Aggravating factors: Not sure  ?Relieving factors: Tramadol  ? ? ? ? ?TODAY'S TREATMENT:  ?10/29/21 ? ?MANUAL THERAPY: ? ?Trigger Point Release right shoulder blade with theracane.  ? ?Trigger point release of right upper trap prone  ? ?Upper trap stretch supine  ? ?THEREX:  ? ?UBE Seat 7 and Resistance 0  ?Lower Trap Setting 2 x 10  ?-min to mod VC  to make V shape with arms  ? ? ? ?PATIENT EDUCATION: ?Education details: Form and technique for appropriate exercise  ?Person educated: Patient ?Education method: Explanation, Demonstration, Verbal cues, and Handouts ?Education comprehension: verbalized understanding, returned demonstration, and verbal cues required ? ? ?HOME EXERCISE PROGRAM: ?Access Code: [email or web address] ?URL: [email or web address] ?Date: 10/29/2021 ?Prepared by: Bradly Chris ? ?Exercises ?Seated Cervical Retraction - 1 x daily - 3 x weekly - 3 sets - 10 reps - 2 hold ?Seated Upper Trapezius Stretch - 1 x daily - 7 x weekly - 1 sets - 3 reps - 60 hold ?Seated Shoulder Row with Anchored Resistance - 1 x daily - 3 x weekly - 3 sets - 10 reps ?Standing Shoulder Horizontal Abduction with Resistance - 1 x daily - 3 x weekly - 3 sets - 10 reps ?Supine Scapular Protraction in Flexion with Dumbbells - 1 x daily - 3 x weekly - 3 sets - 10 reps ?Low Trap Setting at [place] 1 x daily - 4 x weekly - 2 sets - 10 reps ? ? ? PT Short Term Goals   ? ?  ? PT SHORT TERM GOAL #1  ? Title Patient to demonstrate adherence and correct performance of HEP with positive affect on symptoms management.   ? Time 4   ? Period Weeks   ? Status On-going   ? Target Date 10/30/21   ? ?  ?  ? ?  ? ? ? PT Long Term Goals   ? ?  ? PT LONG TERM GOAL #1  ? Title Patient with patient to demonstrate improved cervical rotation ROM greater than 10 degrees bilateral.   ? Time 8   ? Period Weeks   ? Status On-going   ? Target Date 11/27/21   ?  ? PT LONG TERM GOAL #2  ? Title Patient to demonstrate improved FOTO score greater than 10 points to indicate improved perception of the ease and performance of basic mobility for ADL and IADL   ? Time 8   ? Period Weeks   ? Status On-going   ? Target Date 11/27/21   ?  ? PT LONG TERM GOAL #3  ? Title Patient to report decreased and average pain to 3 out of 10 or less decrease in worst pain to 5/10 or less   ? Baseline 6 at worst 3-4 at average   ? Time 8   ? Period Weeks   ? Status On-going   ? Target Date 11/27/21   ? ?  ?  ? ?  ? ? ? Plan   ? ? Clinical Impression Statement Pt continues to experience right sided upper trap pain that is likley result of ongoing trigger points. Trigger points resolved during treatment with soft tissue massage. Pt requiring mod VC and TC because of difficulty with corrdination and sequencing of exercise. She was able to perform exercise without an  increase in her right shoulder blade pain.   ? Personal Factors and Comorbidities Age;Past/Current Experience;Comorbidity 1   ? Examination-Activity Limitations Sleep;Carry;Lift   ? Examination-Participation Restrictions Cleaning   ? Stability/Clinical Decision Making Stable/Uncomplicated   ? Rehab Potential Fair   ? PT Frequency 2x / week   ? PT Duration 8 weeks   ? PT Treatment/Interventions ADLs/Self Care Home Management;Electrical Stimulation;Taping;Passive range of motion;Dry needling;Balance training;Therapeutic activities;Functional mobility training;Moist Heat;Patient/family education   ? PT Next Visit Plan Continue with parascapular exercises and soft tissue massage.   ? PT Home Exercise  Plan [email or web address]   ? Consulted and Agree with Plan of Care Patient   ? ?  ?  ? ?  ? ? ? ?Bradly Chris PT, DPT  ?Daneil Dan, PT ?10/29/2021, 2:18 PM ? ?  ? ?

## 2021-10-31 ENCOUNTER — Ambulatory Visit: Payer: Medicare PPO | Admitting: Physical Therapy

## 2021-11-05 ENCOUNTER — Other Ambulatory Visit: Payer: Self-pay

## 2021-11-05 ENCOUNTER — Encounter: Payer: Self-pay | Admitting: Physical Therapy

## 2021-11-05 ENCOUNTER — Ambulatory Visit: Payer: Medicare PPO | Admitting: Physical Therapy

## 2021-11-05 DIAGNOSIS — G8929 Other chronic pain: Secondary | ICD-10-CM

## 2021-11-05 DIAGNOSIS — M542 Cervicalgia: Secondary | ICD-10-CM | POA: Diagnosis not present

## 2021-11-05 NOTE — Therapy (Signed)
?OUTPATIENT PHYSICAL THERAPY TREATMENT NOTE ? ? ?Patient Name: Patricia Reid ?MRN: [id number] ?DOB:05-01-1941, 81 y.o., female ?Today's Date: 11/05/2021 ? ?PCP: Adin Hector, MD ?REFERRING PROVIDER: Newman Pies, MD ? ? PT End of Session - 11/05/21 1336   ? ? Visit Number 8   ? Number of Visits 16   ? Date for PT Re-Evaluation 11/27/21   ? Authorization Type [employer] Medicare   ? Authorization Time Period 10/02/21-11/27/21   ? Progress Note Due on Visit 10   ? PT Start Time 1335   ? PT Stop Time 2841   ? PT Time Calculation (min) 40 min   ? Activity Tolerance Patient tolerated treatment well;No increased pain   ? Behavior During Therapy [facility] for tasks assessed/performed   ? ?  ?  ? ?  ? ? ?Past Medical History:  ?Diagnosis Date  ? Arthritis   ? COPD (chronic obstructive pulmonary disease) ([place])   ? slim to none  ? GERD (gastroesophageal reflux disease)   ? Hypertension   ? ?Past Surgical History:  ?Procedure Laterality Date  ? BACK SURGERY  2008, 2016  ? CHOLECYSTECTOMY    ? COLONOSCOPY    ? COLONOSCOPY WITH PROPOFOL N/A 01/24/2015  ? Procedure: COLONOSCOPY WITH PROPOFOL;  Surgeon: Manya Silvas, MD;  Location: [facility] ENDOSCOPY;  Service: Endoscopy;  Laterality: N/A;  ? COLONOSCOPY WITH PROPOFOL N/A 04/09/2021  ? Procedure: COLONOSCOPY WITH PROPOFOL;  Surgeon: Lucilla Lame, MD;  Location: [facility] ENDOSCOPY;  Service: Endoscopy;  Laterality: N/A;  ? ESOPHAGOGASTRODUODENOSCOPY N/A 01/24/2015  ? Procedure: ESOPHAGOGASTRODUODENOSCOPY (EGD);  Surgeon: Manya Silvas, MD;  Location: [facility] ENDOSCOPY;  Service: Endoscopy;  Laterality: N/A;  ? ESOPHAGOGASTRODUODENOSCOPY N/A 04/09/2021  ? Procedure: ESOPHAGOGASTRODUODENOSCOPY (EGD);  Surgeon: Lucilla Lame, MD;  Location: [facility] ENDOSCOPY;  Service: Endoscopy;  Laterality: N/A;  ? SAVORY DILATION N/A 01/24/2015  ? Procedure: SAVORY DILATION;  Surgeon: Manya Silvas, MD;  Location: [facility] ENDOSCOPY;  Service: Endoscopy;  Laterality: N/A;  ? TONSILLECTOMY    ? ?Patient Active  Problem List  ? Diagnosis Date Noted  ? COPD (chronic obstructive pulmonary disease) ([place]) 03/21/2021  ? GERD (gastroesophageal reflux disease) 03/21/2021  ? Hyperlipidemia 03/21/2021  ? Hypertension 03/21/2021  ? Aortic atherosclerosis ([place]) 06/09/2017  ? Coronary artery calcification seen on CT scan 06/09/2017  ? Lumbar stenosis with neurogenic claudication 12/31/2016  ? Personal history of tobacco use, presenting hazards to health 05/24/2016  ? Myalgia 04/16/2016  ? Trochanteric bursitis of left hip 05/28/2015  ? DDD (degenerative disc disease), cervical 03/05/2015  ? Spondylolisthesis of lumbar region 08/21/2014  ? ? ?REFERRING DIAG: Cervicalgia and right shoulder pain  ? ?THERAPY DIAG:  ?Cervicalgia ? ?Chronic right shoulder pain ? ?PERTINENT HISTORY:  ? ? ?PRECAUTIONS: None  ? ?SUBJECTIVE: Pt reports numbness and tingling down right arm from elbow down to left pink. She is also experiencing increased right scapula pain. Descirbes feeling hot and dizzy from time to time, but has already been seen by ENT and neurology about symptoms.  ? ?PAIN:  ?Are you having pain? Yes: NPRS scale: 2/10 ?Pain location: right upper trap and right shoulder  ?Pain description: achy pain  ?Aggravating factors: Not sure  ?Relieving factors: Tramadol  ? ? ? ? ?TODAY'S TREATMENT:  ?11/05/21  ? ?THEREX  ? ?UBE Seat 8 , 5 min  ? ?Low Trap Setting 3 x 5  ?-mod VC and TC for shoulder extension and to avoid lumbar extension  ? ?Ulnar Nerve Glossing 2 x 10  ?-  Used visual input with mirror for improved sequencing of exercise  ? ?Doorway Pec Stretch 30 sec x 3  ? ?10/29/21 ? ?MANUAL THERAPY: ? ?Trigger Point Release right shoulder blade with theracane.  ? ?Trigger point release of right upper trap prone  ? ?Upper trap stretch supine  ? ?THEREX:  ? ?UBE Seat 7 and Resistance 0  ?Lower Trap Setting 2 x 10  ?-min to mod VC to make V shape with arms  ? ? ? ?PATIENT EDUCATION: ?Education details: Form and technique for appropriate exercise  ?Person  educated: Patient ?Education method: Explanation, Demonstration, Verbal cues, and Handouts ?Education comprehension: verbalized understanding, returned demonstration, and verbal cues required ? ? ?HOME EXERCISE PROGRAM: ?Access Code: [email or web address] ?URL: [email or web address] ?Date: 11/05/2021 ?Prepared by: Bradly Chris ? ?Exercises ?- Seated Cervical Retraction  - 1 x daily - 4 x weekly - 3 sets - 10 reps - 2 hold ?- Seated Upper Trapezius Stretch  - 1 x daily - 7 x weekly - 1 sets - 3 reps - 60 hold ?- Seated Shoulder Row with Anchored Resistance  - 1 x daily - 4 x weekly - 3 sets - 10 reps ?- Standing Shoulder Horizontal Abduction with Resistance  - 1 x daily - 4 x weekly - 3 sets - 10 reps ?- Supine Scapular Protraction in Flexion with Dumbbells  - 1 x daily - 4 x weekly - 3 sets - 10 reps ?- Low Trap Setting at [place] 1 x daily - 4 x weekly - 3 sets - 5 reps ?- Ulnar Nerve Flossing  - 1 x daily - 7 x weekly - 1 sets - 10 reps ?- Doorway Pec Stretch at 90 Degrees Abduction  - 1 x daily - 7 x weekly - 1 sets - 3 reps - 60 hold ? ? ? PT Short Term Goals   ? ?  ? PT SHORT TERM GOAL #1  ? Title Patient to demonstrate adherence and correct performance of HEP with positive affect on symptoms management.   ? Time 4   ? Period Weeks   ? Status On-going   ? Target Date 10/30/21   ? ?  ?  ? ?  ? ? ? PT Long Term Goals   ? ?  ? PT LONG TERM GOAL #1  ? Title Patient with patient to demonstrate improved cervical rotation ROM greater than 10 degrees bilateral.   ? Time 8   ? Period Weeks   ? Status On-going   ? Target Date 11/27/21   ?  ? PT LONG TERM GOAL #2  ? Title Patient to demonstrate improved FOTO score greater than 10 points to indicate improved perception of the ease and performance of basic mobility for ADL and IADL   ? Time 8   ? Period Weeks   ? Status On-going   ? Target Date 11/27/21   ?  ? PT LONG TERM GOAL #3  ? Title Patient to report decreased and average pain to 3 out of 10 or less decrease in  worst pain to 5/10 or less   ? Baseline 6 at worst 3-4 at average   ? Time 8   ? Period Weeks   ? Status On-going   ? Target Date 11/27/21   ? ?  ?  ? ?  ? ? ? Plan   ? ? Clinical Impression Statement Pt presents with trigger point in rhomboid that resolved with trigger point release. Requires mod VC and  TC to sequence exercise with difficulty with coordination. 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 This tenderness likely worsened due to recent performance of parascapular strengthening exercises.   ? Personal Factors and Comorbidities Age;Past/Current Experience;Comorbidity 1   ? Examination-Activity Limitations Sleep;Carry;Lift   ? Examination-Participation Restrictions Cleaning   ? Stability/Clinical Decision Making Stable/Uncomplicated   ? Rehab Potential Fair   ? PT Frequency 2x / week   ? PT Duration 8 weeks   ? PT Treatment/Interventions ADLs/Self Care Home Management;Electrical Stimulation;Taping;Passive range of motion;Dry needling;Balance training;Therapeutic activities;Functional mobility training;Moist Heat;Patient/family education   ? PT Next Visit Plan Work through right forearm to resolve ulnar nerve entrapment. Continue with parascapular exercises and soft tissue massage.   ? PT Home Exercise Plan [email or web address]   ? Consulted and Agree with Plan of Care Patient   ? ?  ?  ? ?  ? ? ? ?Bradly Chris PT, DPT  ?Daneil Dan, PT ?11/05/2021, 7:35 PM ? ?  ? ?

## 2021-11-06 ENCOUNTER — Ambulatory Visit
Admission: RE | Admit: 2021-11-06 | Discharge: 2021-11-06 | Disposition: A | Discharge status: Home / Self Care | Payer: Medicare PPO | Source: Ambulatory Visit | Attending: Acute Care | Admitting: Acute Care

## 2021-11-06 DIAGNOSIS — I2510 Atherosclerotic heart disease of native coronary artery without angina pectoris: Secondary | ICD-10-CM | POA: Insufficient documentation

## 2021-11-06 DIAGNOSIS — F17210 Nicotine dependence, cigarettes, uncomplicated: Secondary | ICD-10-CM | POA: Diagnosis not present

## 2021-11-06 DIAGNOSIS — I700 Atherosclerosis of aorta: Secondary | ICD-10-CM | POA: Diagnosis not present

## 2021-11-06 DIAGNOSIS — Z122 Encounter for screening for malignant neoplasm of respiratory organs: Secondary | ICD-10-CM | POA: Insufficient documentation

## 2021-11-06 DIAGNOSIS — Z87891 Personal history of nicotine dependence: Secondary | ICD-10-CM | POA: Insufficient documentation

## 2021-11-06 DIAGNOSIS — J439 Emphysema, unspecified: Secondary | ICD-10-CM | POA: Insufficient documentation

## 2021-11-07 ENCOUNTER — Ambulatory Visit: Payer: Medicare PPO | Admitting: Physical Therapy

## 2021-11-07 DIAGNOSIS — M542 Cervicalgia: Secondary | ICD-10-CM

## 2021-11-07 NOTE — Therapy (Signed)
?OUTPATIENT PHYSICAL THERAPY TREATMENT NOTE ? ? ?Patient Name: Patricia Reid ?MRN: [id number] ?DOB:23-Sep-1940, 81 y.o., female ?Today's Date: 11/07/2021 ? ?PCP: Adin Hector, MD ?REFERRING PROVIDER: Newman Pies, MD ? ? PT End of Session - 11/07/21 1513   ? ? Visit Number 9   ? Number of Visits 16   ? Date for PT Re-Evaluation 11/27/21   ? Authorization Type [employer]   ? Authorization Time Period 10/02/21-11/27/21   ? Progress Note Due on Visit 10   ? PT Start Time 1505   ? PT Stop Time 6433   ? PT Time Calculation (min) 40 min   ? Activity Tolerance Patient tolerated treatment well;No increased pain   ? Behavior During Therapy [facility] for tasks assessed/performed   ? ?  ?  ? ?  ? ? ? ?Past Medical History:  ?Diagnosis Date  ? Arthritis   ? COPD (chronic obstructive pulmonary disease) ([place])   ? slim to none  ? GERD (gastroesophageal reflux disease)   ? Hypertension   ? ?Past Surgical History:  ?Procedure Laterality Date  ? BACK SURGERY  2008, 2016  ? CHOLECYSTECTOMY    ? COLONOSCOPY    ? COLONOSCOPY WITH PROPOFOL N/A 01/24/2015  ? Procedure: COLONOSCOPY WITH PROPOFOL;  Surgeon: Manya Silvas, MD;  Location: [facility] ENDOSCOPY;  Service: Endoscopy;  Laterality: N/A;  ? COLONOSCOPY WITH PROPOFOL N/A 04/09/2021  ? Procedure: COLONOSCOPY WITH PROPOFOL;  Surgeon: Lucilla Lame, MD;  Location: [facility] ENDOSCOPY;  Service: Endoscopy;  Laterality: N/A;  ? ESOPHAGOGASTRODUODENOSCOPY N/A 01/24/2015  ? Procedure: ESOPHAGOGASTRODUODENOSCOPY (EGD);  Surgeon: Manya Silvas, MD;  Location: [facility] ENDOSCOPY;  Service: Endoscopy;  Laterality: N/A;  ? ESOPHAGOGASTRODUODENOSCOPY N/A 04/09/2021  ? Procedure: ESOPHAGOGASTRODUODENOSCOPY (EGD);  Surgeon: Lucilla Lame, MD;  Location: [facility] ENDOSCOPY;  Service: Endoscopy;  Laterality: N/A;  ? SAVORY DILATION N/A 01/24/2015  ? Procedure: SAVORY DILATION;  Surgeon: Manya Silvas, MD;  Location: [facility] ENDOSCOPY;  Service: Endoscopy;  Laterality: N/A;  ? TONSILLECTOMY    ? ?Patient Active  Problem List  ? Diagnosis Date Noted  ? COPD (chronic obstructive pulmonary disease) ([place]) 03/21/2021  ? GERD (gastroesophageal reflux disease) 03/21/2021  ? Hyperlipidemia 03/21/2021  ? Hypertension 03/21/2021  ? Aortic atherosclerosis ([place]) 06/09/2017  ? Coronary artery calcification seen on CT scan 06/09/2017  ? Lumbar stenosis with neurogenic claudication 12/31/2016  ? Personal history of tobacco use, presenting hazards to health 05/24/2016  ? Myalgia 04/16/2016  ? Trochanteric bursitis of left hip 05/28/2015  ? DDD (degenerative disc disease), cervical 03/05/2015  ? Spondylolisthesis of lumbar region 08/21/2014  ? ? ?REFERRING DIAG: Cervicalgia and right shoulder pain  ? ?THERAPY DIAG:  ?Cervicalgia ? ?PERTINENT HISTORY: Patricia Reid is a 12 YOF referred to outpatient physical therapy by neurosurgical consult Newman Pies, MD for ongoing cervicalgia.  Prior surgical history significant for lumbar spine fusion and cervical discectomy.  Patient has expressed disinterest in future surgical interventions for her neck pain with.  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  C3-5 ACDF undergone March 2022. Pt Seen by Dr. Sharlet Salina in December for acute on chronic low back pain with leg radiation, and acute on chronic right periscapular pain consistent with cervical muscle spasm. Pt has intermitent achy pain near the upper trap/supraspinatus area on right. Pt reports 6/10 pain at worst.  ? ? ?  PRECAUTIONS: None  ? ?SUBJECTIVE: Pt reports some soreness in her right shoulder blade. She is not sure where the pain is coming from. She continues to experience numbness and tingling down medial side of her right arm that radiates into her left pinky. She wants to know how she is progressing towards her goals to be able to determine whether she should  keep coming to PT. ? ?PAIN:  ?Are you having pain? Yes: NPRS scale: 3/10 ?Pain location: right upper trap and right shoulder  ?Pain description: achy pain  ?Aggravating factors: Not sure  ?Relieving factors: Tramadol  ? ? ?OBJECTIVE: ? ?From eval:  ? ? Right cervical rotation: 55 degrees, with 15 seconds of sustained endrange patient reports decreased symptomatic pain and increased numbness in the arm ulnar distribution of the forearm ?Left cervical rotation: 45 degrees, no change in symptoms with 15 degrees of sustained endrange ?  Cervical extension 35 Gy, no change in symptoms with sustained positioning ? ?TODAY'S TREATMENT:  ?11/07/21 ?UBE Seat 8 -5 min  ?Low Trap Setting 1 x 10  ? ?Cervical Rot AROM R/L 60/60  ?Cervical Ext AROM 35  ?-No symptoms with cervical movements  ? ?FOTO: 66/61 ? ?Review of plan of care and discussion about future appointments given that patient has reached most of her goals.  ? ?MANUAL: Trigger point release of parascapular muscles over right scapula  ?             -Increased trigger points in right rhomboids and lower trap. ? ?11/05/21  ? ?THEREX  ? ?UBE Seat 8 , 5 min  ? ?Low Trap Setting 3 x 5  ?-mod VC and TC for shoulder extension and to avoid lumbar extension  ? ?Ulnar Nerve Glossing 2 x 10  ?-Used visual input with mirror for improved sequencing of exercise  ? ?Doorway Pec Stretch 30 sec x 3  ? ?10/29/21 ? ?MANUAL THERAPY: ? ?Trigger Point Release right shoulder blade with theracane.  ? ?Trigger point release of right upper trap prone  ? ?Upper trap stretch supine  ? ?THEREX:  ? ?UBE Seat 7 and Resistance 0  ?Lower Trap Setting 2 x 10  ?-min to mod VC to make V shape with arms  ? ? ? ?PATIENT EDUCATION: ?Education details: Form and technique for appropriate exercise and education about delayed onset muscle soreness and it's role in her ongoing right shoulder girdle pain. ?Person educated: Patient ?Education method: Explanation, Demonstration, Verbal cues, and Handouts ?Education  comprehension: verbalized understanding, returned demonstration, and verbal cues required ? ? ?HOME EXERCISE PROGRAM: ?Access Code: [email or web address] ?URL: [email or web address] ?Date: 11/05/2021 ?Prepared by: Bradly Chris ? ?Exercises ?- Seated Cervical Retraction  - 1 x daily - 4 x weekly - 3 sets - 10 reps - 2 hold ?- Seated Upper Trapezius Stretch  - 1 x daily - 7 x weekly - 1 sets - 3 reps - 60 hold ?- Seated Shoulder Row with Anchored Resistance  - 1 x daily - 4 x weekly - 3 sets - 10 reps ?- Standing Shoulder Horizontal Abduction with Resistance  - 1 x daily - 4 x weekly - 3 sets - 10 reps ?- Supine Scapular Protraction in Flexion with Dumbbells  - 1 x daily - 4 x weekly - 3 sets - 10 reps ?- Low Trap Setting at [place] 1 x daily - 4 x weekly - 3 sets - 5 reps ?- Ulnar Nerve Flossing  - 1 x daily - 7 x weekly -  1 sets - 10 reps ?- Doorway Pec Stretch at 90 Degrees Abduction  - 1 x daily - 7 x weekly - 1 sets - 3 reps - 60 hold ? ? ? PT Short Term Goals   ? ?  ? PT SHORT TERM GOAL #1  ? Title Patient to demonstrate adherence and correct performance of HEP with positive affect on symptoms management.   ? Time 4   ? Period Weeks   ? Status On-going   ? Target Date 10/30/21   ? ?  ?  ? ?  ? ? ? PT Long Term Goals   ? ?  ? PT LONG TERM GOAL #1  ? Title Patient with patient to demonstrate improved cervical rotation ROM greater than 10 degrees bilateral.   ? Time 8   ? Baseline Eval: 35/35 11/07/21: 60/60   ? Period Weeks   ? Status On-going   ? Target Date 11/27/21   ?  ? PT LONG TERM GOAL #2  ? Title Patient to demonstrate improved FOTO score greater than 10 points to indicate improved perception of the ease and performance of basic mobility for ADL and IADL   ? Time 8   ? Baseline  Eval: 61 11/07/21: 66  ? Period Weeks   ? Status On-going   ? Target Date 11/27/21   ?  ? PT LONG TERM GOAL #3  ? Title Patient to report decreased and average pain to 3 out of 10 or less decrease in worst pain to 5/10 or less   ?  Baseline Eval: 6 at worst 3-4 at average 11/07/21: 3/10   ? Time 8   ? Period Weeks   ? Status  Achieved   ? Target Date 11/27/21   ? ?  ?  ? ?  ? ? ? Plan   ? ? Clinical Impression Statement Pt exhibits an

## 2021-11-11 ENCOUNTER — Other Ambulatory Visit: Payer: Self-pay

## 2021-11-11 ENCOUNTER — Ambulatory Visit: Payer: Medicare PPO | Attending: Neurosurgery

## 2021-11-11 ENCOUNTER — Encounter: Payer: Self-pay | Admitting: Physical Therapy

## 2021-11-11 DIAGNOSIS — Z87891 Personal history of nicotine dependence: Secondary | ICD-10-CM

## 2021-11-11 DIAGNOSIS — M25511 Pain in right shoulder: Secondary | ICD-10-CM | POA: Insufficient documentation

## 2021-11-11 DIAGNOSIS — M542 Cervicalgia: Secondary | ICD-10-CM | POA: Insufficient documentation

## 2021-11-11 DIAGNOSIS — G8929 Other chronic pain: Secondary | ICD-10-CM | POA: Insufficient documentation

## 2021-11-11 DIAGNOSIS — F17210 Nicotine dependence, cigarettes, uncomplicated: Secondary | ICD-10-CM

## 2021-11-11 NOTE — Therapy (Addendum)
?OUTPATIENT PHYSICAL THERAPY Progress Notes/ Recertification ?Reporting Period: 10/02/21 - 11/11/21 ? ? ?Patient Name: Patricia Reid ?MRN: [id number] ?DOB:1941-04-01, 81 y.o., female ?Today's Date: 11/11/2021 ? ?PCP: Adin Hector, MD ?REFERRING PROVIDER: Newman Pies, MD ? ? PT End of Session - 11/11/21 1417   ? ? Visit Number 10   ? Number of Visits 16   ? Date for PT Re-Evaluation 11/27/21   ? Authorization Type [employer]   ? Authorization Time Period 10/02/21-11/27/21   ? Progress Note Due on Visit 10   ? PT Start Time 1416   ? PT Stop Time 1500   ? PT Time Calculation (min) 44 min   ? Activity Tolerance Patient tolerated treatment well;No increased pain   ? Behavior During Therapy [facility] for tasks assessed/performed   ? ?  ?  ? ?  ? ? ? ?Past Medical History:  ?Diagnosis Date  ? Arthritis   ? COPD (chronic obstructive pulmonary disease) ([place])   ? slim to none  ? GERD (gastroesophageal reflux disease)   ? Hypertension   ? ?Past Surgical History:  ?Procedure Laterality Date  ? BACK SURGERY  2008, 2016  ? CHOLECYSTECTOMY    ? COLONOSCOPY    ? COLONOSCOPY WITH PROPOFOL N/A 01/24/2015  ? Procedure: COLONOSCOPY WITH PROPOFOL;  Surgeon: Manya Silvas, MD;  Location: [facility] ENDOSCOPY;  Service: Endoscopy;  Laterality: N/A;  ? COLONOSCOPY WITH PROPOFOL N/A 04/09/2021  ? Procedure: COLONOSCOPY WITH PROPOFOL;  Surgeon: Lucilla Lame, MD;  Location: [place] ENDOSCOPY;  Service: Endoscopy;  Laterality: N/A;  ? ESOPHAGOGASTRODUODENOSCOPY N/A 01/24/2015  ? Procedure: ESOPHAGOGASTRODUODENOSCOPY (EGD);  Surgeon: Manya Silvas, MD;  Location: [facility] ENDOSCOPY;  Service: Endoscopy;  Laterality: N/A;  ? ESOPHAGOGASTRODUODENOSCOPY N/A 04/09/2021  ? Procedure: ESOPHAGOGASTRODUODENOSCOPY (EGD);  Surgeon: Lucilla Lame, MD;  Location: [facility] ENDOSCOPY;  Service: Endoscopy;  Laterality: N/A;  ? SAVORY DILATION N/A 01/24/2015  ? Procedure: SAVORY DILATION;  Surgeon: Manya Silvas, MD;  Location: [facility] ENDOSCOPY;  Service: Endoscopy;   Laterality: N/A;  ? TONSILLECTOMY    ? ?Patient Active Problem List  ? Diagnosis Date Noted  ? COPD (chronic obstructive pulmonary disease) ([place]) 03/21/2021  ? GERD (gastroesophageal reflux disease) 03/21/2021  ? Hyperlipidemia 03/21/2021  ? Hypertension 03/21/2021  ? Aortic atherosclerosis ([place]) 06/09/2017  ? Coronary artery calcification seen on CT scan 06/09/2017  ? Lumbar stenosis with neurogenic claudication 12/31/2016  ? Personal history of tobacco use, presenting hazards to health 05/24/2016  ? Myalgia 04/16/2016  ? Trochanteric bursitis of left hip 05/28/2015  ? DDD (degenerative disc disease), cervical 03/05/2015  ? Spondylolisthesis of lumbar region 08/21/2014  ? ? ?REFERRING DIAG: Cervicalgia and right shoulder pain  ? ?THERAPY DIAG:  ?Cervicalgia ? ?Chronic right shoulder pain ? ?PERTINENT HISTORY: [name] is a 42 YOF referred to outpatient physical therapy by neurosurgical consult Newman Pies, MD for ongoing cervicalgia.  Prior surgical history significant for lumbar spine fusion and cervical discectomy.  Patient has expressed disinterest in future surgical interventions for her neck pain with.  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  C3-5 ACDF undergone March 2022. Pt Seen by Dr. Sharlet Salina in December for acute on chronic low back pain with leg radiation, and acute on chronic right periscapular pain consistent with cervical muscle spasm. Pt has intermitent achy pain near the upper trap/supraspinatus  area on right. Pt reports 6/10 pain at worst.  ? ? ?PRECAUTIONS: None  ? ?SUBJECTIVE: Pt reports 3-4/10 NPS pain in R shoulder blade along lateral border of scapula. Reports N/T in R hand/finger along ulnar distribution ? ?PAIN:  ?Are you having pain? Yes: NPRS scale: 3-4/10 ?Pain location: right upper trap and right  shoulder  ?Pain description: achy pain  ?Aggravating factors: Not sure  ?Relieving factors: Tramadol  ? ? ?OBJECTIVE: ? ?From eval:  ? ? Right cervical rotation: 55 degrees, with 15 seconds of sustained endrange patient reports decreased symptomatic pain and increased numbness in the arm ulnar distribution of the forearm ?Left cervical rotation: 45 degrees, no change in symptoms with 15 degrees of sustained endrange ?  Cervical extension 35 Gy, no change in symptoms with sustained positioning ? ?TODAY'S TREATMENT:  ? ?11/11/2021: ? ?There.ex:  ? UBE Seat 8, 4 min for UE warm up. 2 minutes forward, 2 minutes backward ? Ulnar nerve flossing seated: Required max multi modal cuing and PT demo. 2x15 requiring hand over hand facilitation.  ? Seated cervical retraction: 2x20, pillow behind occiput for resistance. Initially requiring mod multimodal cues for form/technique with good carryover. Consistent VC's for speed of motion ? Seated upper trap stretch: 2x15 sec/side.  ? Seated B shoulder ER with YTB: 2x8, max multimodal cuing for form/technique  ? Standing lower trap setting on wall: 2x8, min multimodal cuing for form/technique.  ? ? ?Manual Therapy: Seated on mat table for 10 minutes ? STM to R sided middle traps, rhomboids and infraspinatus for trigger point release to improve R shoulder pain ?  ?  ?  ?Pain post session: 1/10 NPS post session ? ? ? ?PATIENT EDUCATION: ?Education details: Form and technique for appropriate exercise  ?Person educated: Patient ?Education method: Explanation, Demonstration, Verbal cues, and Handouts ?Education comprehension: verbalized understanding, returned demonstration, and verbal cues required ? ? ?HOME EXERCISE PROGRAM: ?Access Code: [email or web address] ?URL: [email or web address] ?Date: 11/05/2021 ?Prepared by: Bradly Chris ? ?Exercises ?- Seated Cervical Retraction  - 1 x daily - 4 x weekly - 3 sets - 10 reps - 2 hold ?- Seated Upper Trapezius Stretch  - 1 x daily - 7 x weekly  - 1 sets - 3 reps - 60 hold ?- Seated Shoulder Row with Anchored Resistance  - 1 x daily - 4 x weekly - 3 sets - 10 reps ?- Standing Shoulder Horizontal Abduction with Resistance  - 1 x daily - 4 x weekly - 3 sets - 10 reps ?- Supine Scapular Protraction in Flexion with Dumbbells  - 1 x daily - 4 x weekly - 3 sets - 10 reps ?- Low Trap Setting at [place] 1 x daily - 4 x weekly - 3 sets - 5 reps ?- Ulnar Nerve Flossing  - 1 x daily - 7 x weekly - 1 sets - 10 reps ?- Doorway Pec Stretch at 90 Degrees Abduction  - 1 x daily - 7 x weekly - 1 sets - 3 reps - 60 hold ? ? ? PT Short Term Goals   ? ?  ? PT SHORT TERM GOAL #1  ? Title Patient to demonstrate adherence and correct performance of HEP with positive affect on symptoms management.   ? Time 4   ? Period Weeks   ? Status On-going   ? Target Date 10/30/21   ? ?  ?  ? ?  ? ? ? PT Long Term Goals   ? ?  ?  PT LONG TERM GOAL #1  ? Title Patient with patient to demonstrate improved cervical rotation ROM greater than 10 degrees bilateral.   ? Time 8   ? Baseline Eval: 35/35 11/07/21: 60/60   ? Period Weeks   ? Status On-going   ? Target Date 11/27/21   ?  ? PT LONG TERM GOAL #2  ? Title Patient to demonstrate improved FOTO score greater than 10 points to indicate improved perception of the ease and performance of basic mobility for ADL and IADL   ? Time 8   ? Baseline  Eval: 61 11/07/21: 66  ? Period Weeks   ? Status On-going   ? Target Date 11/27/21   ?  ? PT LONG TERM GOAL #3  ? Title Patient to report decreased and average pain to 3 out of 10 or less decrease in worst pain to 5/10 or less   ? Baseline Eval: 6 at worst 3-4 at average 11/07/21: 3/10   ? Time 8   ? Period Weeks   ? Status  Achieved   ? Target Date 11/27/21   ? ?  ?  ? ?  ? ? ? Plan   ? ? Clinical Impression Statement Pt on 10th visit. See last visit for updates on progress towards goals as progress note performed on that date. Appears accomplishing close to all goals. Pt tolerating session well with decreased  R shoulder pain from 4/10 Nps to 1/10 NPS with use of therex and manual intervention. Pt requiring frequent multimodal cuing throughout all exercises for form/technique for correct performance. Pt encouraged t

## 2021-11-14 ENCOUNTER — Ambulatory Visit: Payer: Medicare PPO | Admitting: Physical Therapy

## 2021-11-19 ENCOUNTER — Encounter: Payer: Medicare PPO | Admitting: Physical Therapy

## 2021-11-21 ENCOUNTER — Encounter: Payer: Medicare PPO | Admitting: Physical Therapy

## 2021-11-25 ENCOUNTER — Telehealth: Payer: Self-pay | Admitting: Physical Therapy

## 2021-11-25 ENCOUNTER — Ambulatory Visit: Payer: Medicare PPO | Admitting: Physical Therapy

## 2021-11-25 NOTE — Telephone Encounter (Signed)
Called pt to inquiry about why she had missed her apt. Pt reports that she did not know that she still had more PT apts and that she thought she had cancelled the remainder of her apts. She is unsure about whether she would like to reschedule today's apt and she will call back to let office know if she would like to reschedule otherwise pt will be discharged. She has no remaining apts aside from today and she feels that her neck and shoulder have resolved for the most part.  ?

## 2021-11-28 ENCOUNTER — Ambulatory Visit: Payer: Medicare PPO | Admitting: Physical Therapy

## 2021-11-28 DIAGNOSIS — M542 Cervicalgia: Secondary | ICD-10-CM | POA: Diagnosis not present

## 2021-11-28 DIAGNOSIS — G8929 Other chronic pain: Secondary | ICD-10-CM

## 2021-11-28 NOTE — Therapy (Addendum)
?OUTPATIENT PHYSICAL THERAPY TREATMENT NOTE/ Discharge Summary ? ?Discharge Summary: Pt has elected to discharge from PT before end of her POC, because she feels she has improved to point where she can manage her condition through an HEP.  ? ?Patient Name: Patricia Reid ?MRN: [id number] ?DOB:[birth year], 81 y.o., female ?Today's Date: 11/28/2021 ? ?PCP: Patricia Hector, MD ?REFERRING PROVIDER: Newman Pies, MD ? ? PT End of Session - 11/28/21 1431   ? ? Visit Number 11   ? Number of Visits 16   ? Date for PT Re-Evaluation 11/27/21   ? Authorization Type [employer]   ? Authorization Time Period 11/27/21-01/27/22   ? Progress Note Due on Visit 10   ? PT Start Time 1330   ? PT Stop Time 6789   ? PT Time Calculation (min) 45 min   ? Activity Tolerance Patient tolerated treatment well;No increased pain   ? Behavior During Therapy [facility] for tasks assessed/performed   ? ?  ?  ? ?  ? ? ? ? ?Past Medical History:  ?Diagnosis Date  ? Arthritis   ? COPD (chronic obstructive pulmonary disease) ([place])   ? slim to none  ? GERD (gastroesophageal reflux disease)   ? Hypertension   ? ?Past Surgical History:  ?Procedure Laterality Date  ? BACK SURGERY  2008, 2016  ? CHOLECYSTECTOMY    ? COLONOSCOPY    ? COLONOSCOPY WITH PROPOFOL N/A 01/24/2015  ? Procedure: COLONOSCOPY WITH PROPOFOL;  Surgeon: Patricia Silvas, MD;  Location: [facility] ENDOSCOPY;  Service: Endoscopy;  Laterality: N/A;  ? COLONOSCOPY WITH PROPOFOL N/A 04/09/2021  ? Procedure: COLONOSCOPY WITH PROPOFOL;  Surgeon: Patricia Lame, MD;  Location: [facility] ENDOSCOPY;  Service: Endoscopy;  Laterality: N/A;  ? ESOPHAGOGASTRODUODENOSCOPY N/A 01/24/2015  ? Procedure: ESOPHAGOGASTRODUODENOSCOPY (EGD);  Surgeon: Patricia Silvas, MD;  Location: [facility] ENDOSCOPY;  Service: Endoscopy;  Laterality: N/A;  ? ESOPHAGOGASTRODUODENOSCOPY N/A 04/09/2021  ? Procedure: ESOPHAGOGASTRODUODENOSCOPY (EGD);  Surgeon: Patricia Lame, MD;  Location: [facility] ENDOSCOPY;  Service: Endoscopy;  Laterality: N/A;  ?  SAVORY DILATION N/A 01/24/2015  ? Procedure: SAVORY DILATION;  Surgeon: Patricia Silvas, MD;  Location: [facility] ENDOSCOPY;  Service: Endoscopy;  Laterality: N/A;  ? TONSILLECTOMY    ? ?Patient Active Problem List  ? Diagnosis Date Noted  ? COPD (chronic obstructive pulmonary disease) ([place]) 03/21/2021  ? GERD (gastroesophageal reflux disease) 03/21/2021  ? Hyperlipidemia 03/21/2021  ? Hypertension 03/21/2021  ? Aortic atherosclerosis ([place]) 06/09/2017  ? Coronary artery calcification seen on CT scan 06/09/2017  ? Lumbar stenosis with neurogenic claudication 12/31/2016  ? Personal history of tobacco use, presenting hazards to health 05/24/2016  ? Myalgia 04/16/2016  ? Trochanteric bursitis of left hip 05/28/2015  ? DDD (degenerative disc disease), cervical 03/05/2015  ? Spondylolisthesis of lumbar region 08/21/2014  ? ? ?REFERRING DIAG: Cervicalgia and right shoulder pain  ? ?THERAPY DIAG:  ?Cervicalgia ? ?Chronic right shoulder pain ? ?PERTINENT HISTORY: Patricia Reid is a 39 YOF referred to outpatient physical therapy by neurosurgical consult Patricia Pies, MD for ongoing cervicalgia.  Prior surgical history significant for lumbar spine fusion and cervical discectomy.  Patient has expressed disinterest in future surgical interventions for her neck pain with.  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  C3-5 ACDF undergone March 2022. Pt Seen by Patricia Reid in December for acute  on chronic low back pain with leg radiation, and acute on chronic right periscapular pain consistent with cervical muscle spasm. Pt has intermitent achy pain near the upper trap/supraspinatus area on right. Pt reports 6/10 pain at worst.  ? ? ?PRECAUTIONS: None  ? ?SUBJECTIVE: Pt reports an increased right shoulder pain since last session and left hip pain. She is still  experiencing N/T down right hand ulnar distribution.  ? ?PAIN:  ?Are you having pain? Yes: NPRS scale: 3-4/10 ?Pain location: right upper trap and right shoulder  ?Pain description: achy pain  ?Aggravating factors: Not sure  ?Relieving factors: Tramadol  ? ? ?OBJECTIVE: ? ?From eval:  ? ? Right cervical rotation: 55 degrees, with 15 seconds of sustained endrange patient reports decreased symptomatic pain and increased numbness in the arm ulnar distribution of the forearm ?Left cervical rotation: 45 degrees, no change in symptoms with 15 degrees of sustained endrange ?  Cervical extension 35 Gy, no change in symptoms with sustained positioning ? ?TODAY'S TREATMENT:  ? ?11/28/21: ?                        UBE Seat 7 , 5 min  ?                        Review HEP and identify exercises that pt is struggling with.  ?                        Rhomboid Doorway Stretch 2 x 60 sec  ?                        Scapular Retraction Red TB 3 x 10  ?                        Pec Stretch 3 x 60 sec  ?                         ?                           ? ?11/11/2021: ? ?There.ex:  ? UBE Seat 8, 4 min for UE warm up. 2 minutes forward, 2 minutes backward ? Ulnar nerve flossing seated: Required max multi modal cuing and PT demo. 2x15 requiring hand over hand facilitation.  ? Seated cervical retraction: 2x20, pillow behind occiput for resistance. Initially requiring mod multimodal cues for form/technique with good carryover. Consistent VC's for speed of motion ? Seated upper trap stretch: 2x15 sec/side.  ? Seated B shoulder ER with YTB: 2x8, max multimodal cuing for form/technique  ? Standing lower trap setting on wall: 2x8, min multimodal cuing for form/technique.  ? ? ?Manual Therapy: Seated on mat table for 10 minutes ? STM to R sided middle traps, rhomboids and infraspinatus for trigger point release to improve R shoulder pain ?  ?  ?  ?Pain post session: 1/10 NPS post session ? ? ? ?PATIENT EDUCATION: ?Education details: Form and technique  for appropriate exercise  ?Person educated: Patient ?Education method: Explanation, Demonstration, Verbal cues, and Handouts ?Education comprehension: verbalized understanding, returned demonstration, and verbal cues required ? ? ?HOME EXERCISE PROGRAM: ?Access Code: [email or web address] ?URL: [email or web address] ?Date: 11/05/2021 ?Prepared by: Bradly Chris ? ?Exercises ?- Seated Cervical Retraction  - 1 x daily - 4  x weekly - 3 sets - 10 reps - 2 hold ?- Seated Upper Trapezius Stretch  - 1 x daily - 7 x weekly - 1 sets - 3 reps - 60 hold ?- Seated Shoulder Row with Anchored Resistance  - 1 x daily - 4 x weekly - 3 sets - 10 reps ?- Standing Shoulder Horizontal Abduction with Resistance  - 1 x daily - 4 x weekly - 3 sets - 10 reps ?- Supine Scapular Protraction in Flexion with Dumbbells  - 1 x daily - 4 x weekly - 3 sets - 10 reps ?- Low Trap Setting at [place] 1 x daily - 4 x weekly - 3 sets - 5 reps ?- Ulnar Nerve Flossing  - 1 x daily - 7 x weekly - 1 sets - 10 reps ?- Doorway Pec Stretch at 90 Degrees Abduction  - 1 x daily - 7 x weekly - 1 sets - 3 reps - 60 hold ? ? ? PT Short Term Goals   ? ?  ? PT SHORT TERM GOAL #1  ? Title Patient to demonstrate adherence and correct performance of HEP with positive affect on symptoms management.   ? Time 4   ? Period Weeks   ? Status  Achieved   ? Target Date 11/28/21   ? ?  ?  ? ?  ? ? ? PT Long Term Goals   ? ?  ? PT LONG TERM GOAL #1  ? Title Patient with patient to demonstrate improved cervical rotation ROM greater than 10 degrees bilateral.   ? Time 8   ? Baseline Eval: 35/35 11/07/21: 60/60   ? Period Weeks   ? Status Achieved   ? Target Date 01/27/22   ?  ? PT LONG TERM GOAL #2  ? Title Patient to demonstrate improved FOTO score greater than 10 points to indicate improved perception of the ease and performance of basic mobility for ADL and IADL   ? Time 8   ? Baseline  Eval: 61 11/07/21: 66/61  ? Period Weeks   ? Status Achieved   ? Target Date 01/27/22   ?  ?  PT LONG TERM GOAL #3  ? Title Patient to report decreased and average pain to 3 out of 10 or less decrease in worst pain to 5/10 or less   ? Baseline Eval: 6 at worst 3-4 at average 11/07/21: 3/10   ? Time

## 2021-11-28 NOTE — Addendum Note (Signed)
Addended by: Daneil Dan on: 11/28/2021 03:13 PM ? ? Modules accepted: Orders ? ?

## 2021-12-05 ENCOUNTER — Ambulatory Visit: Payer: Medicare PPO | Admitting: Physical Therapy

## 2021-12-11 ENCOUNTER — Telehealth: Payer: Self-pay

## 2021-12-11 NOTE — Telephone Encounter (Signed)
PA has been submitted via [email or web address] to [facility] for [employer]---awaiting response  ?

## 2021-12-27 NOTE — Telephone Encounter (Signed)
Has been approved through 08/10/2022

## 2022-05-26 ENCOUNTER — Other Ambulatory Visit (HOSPITAL_COMMUNITY): Payer: Self-pay | Admitting: Neurosurgery

## 2022-05-26 ENCOUNTER — Other Ambulatory Visit: Payer: Self-pay | Admitting: Neurosurgery

## 2022-05-26 DIAGNOSIS — M4312 Spondylolisthesis, cervical region: Secondary | ICD-10-CM

## 2022-06-03 ENCOUNTER — Ambulatory Visit
Admission: RE | Admit: 2022-06-03 | Discharge: 2022-06-03 | Disposition: A | Discharge status: Home / Self Care | Payer: Medicare PPO | Source: Ambulatory Visit | Attending: Neurosurgery | Admitting: Neurosurgery

## 2022-06-03 DIAGNOSIS — M4312 Spondylolisthesis, cervical region: Secondary | ICD-10-CM | POA: Diagnosis present

## 2022-06-16 ENCOUNTER — Other Ambulatory Visit: Payer: Self-pay | Admitting: General Surgery

## 2022-06-16 DIAGNOSIS — R928 Other abnormal and inconclusive findings on diagnostic imaging of breast: Secondary | ICD-10-CM

## 2022-07-24 ENCOUNTER — Ambulatory Visit
Admission: RE | Admit: 2022-07-24 | Discharge: 2022-07-24 | Disposition: A | Discharge status: Home / Self Care | Payer: Medicare PPO | Source: Ambulatory Visit | Attending: General Surgery | Admitting: General Surgery

## 2022-07-24 DIAGNOSIS — R928 Other abnormal and inconclusive findings on diagnostic imaging of breast: Secondary | ICD-10-CM | POA: Insufficient documentation

## 2023-11-30 ENCOUNTER — Other Ambulatory Visit: Payer: Self-pay | Admitting: Student

## 2023-11-30 DIAGNOSIS — M4722 Other spondylosis with radiculopathy, cervical region: Secondary | ICD-10-CM

## 2023-12-08 ENCOUNTER — Ambulatory Visit
Admission: RE | Admit: 2023-12-08 | Discharge: 2023-12-08 | Disposition: A | Discharge status: Home / Self Care | Source: Ambulatory Visit | Attending: Student | Admitting: Student

## 2023-12-08 DIAGNOSIS — M4722 Other spondylosis with radiculopathy, cervical region: Secondary | ICD-10-CM | POA: Insufficient documentation

## 2024-01-12 ENCOUNTER — Other Ambulatory Visit: Payer: Self-pay | Admitting: Unknown Physician Specialty

## 2024-01-12 DIAGNOSIS — R0981 Nasal congestion: Secondary | ICD-10-CM

## 2024-01-12 DIAGNOSIS — J329 Chronic sinusitis, unspecified: Secondary | ICD-10-CM

## 2024-01-15 ENCOUNTER — Ambulatory Visit
Admission: RE | Admit: 2024-01-15 | Discharge: 2024-01-15 | Disposition: A | Discharge status: Home / Self Care | Source: Ambulatory Visit | Attending: Unknown Physician Specialty | Admitting: Unknown Physician Specialty

## 2024-01-15 DIAGNOSIS — J329 Chronic sinusitis, unspecified: Secondary | ICD-10-CM

## 2024-01-15 DIAGNOSIS — R0981 Nasal congestion: Secondary | ICD-10-CM

## 2024-03-17 ENCOUNTER — Other Ambulatory Visit: Payer: Self-pay | Admitting: Unknown Physician Specialty

## 2024-03-17 ENCOUNTER — Ambulatory Visit
Admission: RE | Admit: 2024-03-17 | Discharge: 2024-03-17 | Disposition: A | Discharge status: Home / Self Care | Source: Ambulatory Visit | Attending: Unknown Physician Specialty | Admitting: Unknown Physician Specialty

## 2024-03-17 DIAGNOSIS — R062 Wheezing: Secondary | ICD-10-CM
